# Patient Record
Sex: Female | Born: 1980
Health system: Southern US, Community
[De-identification: ages and names within clinical notes are randomized; demographics above are authoritative.]

## PROBLEM LIST (undated history)

## (undated) DIAGNOSIS — F419 Anxiety disorder, unspecified: Secondary | ICD-10-CM

## (undated) DIAGNOSIS — K219 Gastro-esophageal reflux disease without esophagitis: Secondary | ICD-10-CM

## (undated) DIAGNOSIS — F32A Depression, unspecified: Secondary | ICD-10-CM

## (undated) DIAGNOSIS — N390 Urinary tract infection, site not specified: Secondary | ICD-10-CM

## (undated) HISTORY — PX: COLPOSCOPY W/ BIOPSY / CURETTAGE: SUR283

## (undated) HISTORY — PX: CRYOTHERAPY: SHX1416

## (undated) HISTORY — PX: WISDOM TOOTH EXTRACTION: SHX21

## (undated) HISTORY — PX: TONSILLECTOMY: SUR1361

---
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
## 1999-09-13 ENCOUNTER — Other Ambulatory Visit: Admission: RE | Admit: 1999-09-13 | Discharge: 1999-09-13 | Payer: Self-pay | Admitting: Obstetrics and Gynecology

## 1999-11-15 ENCOUNTER — Other Ambulatory Visit: Admission: RE | Admit: 1999-11-15 | Discharge: 1999-11-15 | Payer: Self-pay | Admitting: Obstetrics and Gynecology

## 1999-11-16 ENCOUNTER — Other Ambulatory Visit: Admission: RE | Admit: 1999-11-16 | Discharge: 1999-11-16 | Payer: Self-pay | Admitting: Obstetrics and Gynecology

## 1999-11-16 ENCOUNTER — Encounter (INDEPENDENT_AMBULATORY_CARE_PROVIDER_SITE_OTHER): Payer: Self-pay

## 2000-05-28 ENCOUNTER — Other Ambulatory Visit: Admission: RE | Admit: 2000-05-28 | Discharge: 2000-05-28 | Payer: Self-pay | Admitting: Obstetrics and Gynecology

## 2000-05-29 ENCOUNTER — Other Ambulatory Visit: Admission: RE | Admit: 2000-05-29 | Discharge: 2000-05-29 | Payer: Self-pay | Admitting: Obstetrics and Gynecology

## 2000-05-29 ENCOUNTER — Encounter (INDEPENDENT_AMBULATORY_CARE_PROVIDER_SITE_OTHER): Payer: Self-pay

## 2000-12-27 ENCOUNTER — Other Ambulatory Visit: Admission: RE | Admit: 2000-12-27 | Discharge: 2000-12-27 | Payer: Self-pay | Admitting: Obstetrics and Gynecology

## 2001-09-08 ENCOUNTER — Other Ambulatory Visit: Admission: RE | Admit: 2001-09-08 | Discharge: 2001-09-08 | Payer: Self-pay | Admitting: Obstetrics and Gynecology

## 2002-02-19 ENCOUNTER — Other Ambulatory Visit: Admission: RE | Admit: 2002-02-19 | Discharge: 2002-02-19 | Payer: Self-pay | Admitting: Obstetrics and Gynecology

## 2002-08-19 ENCOUNTER — Other Ambulatory Visit: Admission: RE | Admit: 2002-08-19 | Discharge: 2002-08-19 | Payer: Self-pay | Admitting: Obstetrics and Gynecology

## 2003-03-25 ENCOUNTER — Other Ambulatory Visit: Admission: RE | Admit: 2003-03-25 | Discharge: 2003-03-25 | Payer: Self-pay | Admitting: Obstetrics and Gynecology

## 2004-05-19 ENCOUNTER — Other Ambulatory Visit: Admission: RE | Admit: 2004-05-19 | Discharge: 2004-05-19 | Payer: Self-pay | Admitting: Obstetrics and Gynecology

## 2005-12-19 ENCOUNTER — Emergency Department (HOSPITAL_COMMUNITY): Admission: EM | Admit: 2005-12-19 | Discharge: 2005-12-20 | Payer: Self-pay | Admitting: Emergency Medicine

## 2009-04-26 ENCOUNTER — Ambulatory Visit (HOSPITAL_COMMUNITY): Admission: RE | Admit: 2009-04-26 | Discharge: 2009-04-26 | Payer: Self-pay | Admitting: Obstetrics and Gynecology

## 2009-10-25 ENCOUNTER — Encounter: Admission: RE | Admit: 2009-10-25 | Discharge: 2009-10-25 | Payer: Self-pay | Admitting: Obstetrics and Gynecology

## 2010-02-19 ENCOUNTER — Emergency Department (HOSPITAL_COMMUNITY)
Admission: EM | Admit: 2010-02-19 | Discharge: 2010-02-19 | Disposition: A | Discharge status: Home / Self Care | Payer: BC Managed Care – PPO | Attending: Emergency Medicine | Admitting: Emergency Medicine

## 2010-02-19 ENCOUNTER — Emergency Department (HOSPITAL_COMMUNITY): Payer: BC Managed Care – PPO

## 2010-02-19 DIAGNOSIS — O200 Threatened abortion: Secondary | ICD-10-CM | POA: Insufficient documentation

## 2010-02-19 DIAGNOSIS — R109 Unspecified abdominal pain: Secondary | ICD-10-CM | POA: Insufficient documentation

## 2010-02-19 DIAGNOSIS — N898 Other specified noninflammatory disorders of vagina: Secondary | ICD-10-CM | POA: Insufficient documentation

## 2010-02-19 LAB — URINALYSIS, ROUTINE W REFLEX MICROSCOPIC
Ketones, ur: NEGATIVE mg/dL
Leukocytes, UA: NEGATIVE
Nitrite: NEGATIVE
Protein, ur: NEGATIVE mg/dL
Urine Glucose, Fasting: NEGATIVE mg/dL
Urobilinogen, UA: 0.2 mg/dL (ref 0.0–1.0)

## 2010-02-19 LAB — POCT PREGNANCY, URINE: Preg Test, Ur: NEGATIVE

## 2010-02-19 LAB — URINE MICROSCOPIC-ADD ON

## 2010-02-19 LAB — WET PREP, GENITAL: Trich, Wet Prep: NONE SEEN

## 2010-02-19 LAB — ABO/RH: ABO/RH(D): O POS

## 2010-02-20 LAB — GC/CHLAMYDIA PROBE AMP, GENITAL
Chlamydia, DNA Probe: NEGATIVE
GC Probe Amp, Genital: NEGATIVE

## 2010-04-05 LAB — ABO/RH: ABO/RH(D): O POS

## 2010-05-23 LAB — GC/CHLAMYDIA PROBE AMP, GENITAL: Gonorrhea: NEGATIVE

## 2010-05-23 LAB — HEPATITIS B SURFACE ANTIGEN: Hepatitis B Surface Ag: NEGATIVE

## 2010-05-23 LAB — ABO/RH

## 2010-05-23 LAB — RPR: RPR: NONREACTIVE

## 2010-05-23 LAB — HIV ANTIBODY (ROUTINE TESTING W REFLEX): HIV: NONREACTIVE

## 2010-06-27 DIAGNOSIS — M5430 Sciatica, unspecified side: Secondary | ICD-10-CM

## 2010-11-30 LAB — STREP B DNA PROBE: GBS: NEGATIVE

## 2010-12-18 ENCOUNTER — Inpatient Hospital Stay (HOSPITAL_COMMUNITY)
Admission: AD | Admit: 2010-12-18 | Discharge: 2010-12-18 | Disposition: A | Discharge status: Home / Self Care | Payer: BC Managed Care – PPO | Source: Ambulatory Visit | Attending: Obstetrics and Gynecology | Admitting: Obstetrics and Gynecology

## 2010-12-18 ENCOUNTER — Encounter (HOSPITAL_COMMUNITY): Payer: Self-pay | Admitting: *Deleted

## 2010-12-18 ENCOUNTER — Emergency Department: Payer: Self-pay | Admitting: Internal Medicine

## 2010-12-18 DIAGNOSIS — Z3400 Encounter for supervision of normal first pregnancy, unspecified trimester: Secondary | ICD-10-CM

## 2010-12-18 DIAGNOSIS — O99891 Other specified diseases and conditions complicating pregnancy: Secondary | ICD-10-CM | POA: Insufficient documentation

## 2010-12-18 DIAGNOSIS — M5430 Sciatica, unspecified side: Secondary | ICD-10-CM | POA: Insufficient documentation

## 2010-12-18 HISTORY — DX: Anxiety disorder, unspecified: F41.9

## 2010-12-18 HISTORY — DX: Urinary tract infection, site not specified: N39.0

## 2010-12-18 MED ORDER — HYDROCODONE-ACETAMINOPHEN 5-500 MG PO TABS: 1.0000 | ORAL_TABLET | Freq: Four times a day (QID) | ORAL | Status: AC | PRN

## 2010-12-18 NOTE — Progress Notes (Signed)
Pt has been having siatica pain but it got worse this evening and she went to [facility] and they gave her a vicodin around [phone] and was told that Dr Arelia Sneddon wanted her to come here and be evaluated-her pain in now better that it has been all day

## 2010-12-18 NOTE — ED Provider Notes (Signed)
[name] y.o.G1P0 @[redacted]w[redacted]d  Chief Complaint  Patient presents with  . Hip Pain    SUBJECTIVE  HPI: Transferred from [facility] where she was evaluated for left sciatica. She was given Vicodin with good relief. The pain is constant and begins that all the sciatic notch and radiates down posterior left leg and at its worse she has numbness of her left foot. She's tried local  heat and cold packs with little relief. Aware of mild nonpainful contractions. No vaginal bleeding. No leaking. Good fetal movement.  Past Medical History  Diagnosis Date  . Anxiety   . UTI (lower urinary tract infection)    Past Surgical History  Procedure Date  . Tonsillectomy   . Wisdom tooth extraction   . Cryotherapy   . Colposcopy w/ biopsy / curettage    History   Social History  . Marital Status: Single    Spouse Name: N/A    Number of Children: N/A  . Years of Education: N/A   Occupational History  . Not on file.   Social History Main Topics  . Smoking status: Former Smoker -- 0.5 packs/day for 12 years    Types: Cigarettes  . Smokeless tobacco: Not on file  . Alcohol Use: No  . Drug Use: No  . Sexually Active: Yes   Other Topics Concern  . Not on file   Social History Narrative  . No narrative on file   No current facility-administered medications on file prior to encounter.   No current outpatient prescriptions on file prior to encounter.   Allergies  Allergen Reactions  . Penicillins Other (See Comments)    Childhood allergy; reaction unknown  . Sulfa Antibiotics Other (See Comments)    Childhood allergy; reaction unknown    ROS: Pertinent items in HPI  OBJECTIVE  BP 114/65  Pulse 72  Temp(Src) 99 F (37.2 C) (Oral)  Resp 20  Ht 5\' 1"  (1.549 m)  Wt 63.957 kg (141 lb)  BMI 26.64 kg/m2  SpO2 99%  Toco: irreg mild UCs FHR 120 baseline, reactive VE per RN: ftp/80/-2 vtx  Physical Exam  Constitutional: She is oriented to person, place, and  time. She appears distressed.  HENT:  Head: Normocephalic.  Neck: Neck supple.  Cardiovascular: Normal rate.   Pulmonary/Chest: Effort normal.  Abdominal: Soft. There is no tenderness.  Musculoskeletal: Normal range of motion. She exhibits tenderness. She exhibits no edema.       Tender over left buttock and along post leg and popliteal are.   Neurological: She is alert and oriented to person, place, and time. She has normal reflexes. Coordination normal.     ASSESSMENT  Left sciatica G1  At [redacted]w[redacted]d with reassuring fetal HR   PLAN  Rx Vicodin #15. F/U in office this week. Kick counts

## 2010-12-18 NOTE — Progress Notes (Signed)
Pt was instructed by MD to [facility].  Pt states she feels much better since the vicodin.

## 2010-12-28 ENCOUNTER — Telehealth (HOSPITAL_COMMUNITY): Payer: Self-pay | Admitting: *Deleted

## 2010-12-28 ENCOUNTER — Encounter (HOSPITAL_COMMUNITY): Payer: Self-pay | Admitting: *Deleted

## 2010-12-28 NOTE — Telephone Encounter (Signed)
Preadmission screen  

## 2010-12-29 NOTE — H&P (Signed)
[name], [name] NO.:  [id number]  MEDICAL RECORD NO.:  [id number]  LOCATION:                                 FACILITY:  PHYSICIAN:  Duke Salvia. Marcelle Overlie, M.D.DATE OF BIRTH:  Oct 23, 1980  DATE OF ADMISSION:  12/30/2010 DATE OF DISCHARGE:                             HISTORY & PHYSICAL   CHIEF COMPLAINT:  For labor induction at term, oligohydramnios.  HISTORY OF PRESENT ILLNESS:  A 30 year old, G4, P 0-0-3-0, EDD December 27, 2010, was seen by Dr. Henderson Cloud on December 27, 2010, vertex by ultrasound, EFW 6 pounds 13 ounces with an AFI of the 5th percentile. Presents now for labor induction.  GBS was negative.  The remainder of her prenatal course has been uneventful with 1-hour GTT of 133.  PAST MEDICAL HISTORY:  Please see the [facility] form for details.  PHYSICAL EXAMINATION:  VITAL SIGNS:  Temp 98.2, blood pressure 114/78. HEENT:  Unremarkable. NECK:  Supple without masses. LUNGS:  Clear. CARDIOVASCULAR:  Regular rate and rhythm without murmurs, rubs, or gallops heard. BREASTS:  Not examined. PELVIC:  Term fundal height.  Fetal heart rate 140, cervix was 1, 90%, - 1 vertex per Dr. Huel Coventry exam.  Urine negative for protein.  IMPRESSION: 1. Term intrauterine pregnancy. 2. Oligohydramnios by ultrasound.  PLAN:  Pitocin/AROM labor induction.  Protocol reviewed with the patient.     Becca Bayne M. Marcelle Overlie, M.D.     RM[facility]/MEDQ  D:  12/29/2010  T:  12/29/2010  Job:  [id number]

## 2010-12-30 ENCOUNTER — Encounter (HOSPITAL_COMMUNITY): Payer: Self-pay | Admitting: Anesthesiology

## 2010-12-30 ENCOUNTER — Other Ambulatory Visit: Payer: Self-pay | Admitting: Obstetrics and Gynecology

## 2010-12-30 ENCOUNTER — Inpatient Hospital Stay (HOSPITAL_COMMUNITY): Payer: BC Managed Care – PPO | Admitting: Anesthesiology

## 2010-12-30 ENCOUNTER — Encounter (HOSPITAL_COMMUNITY)
Admission: RE | Disposition: A | Discharge status: Home / Self Care | Payer: Self-pay | Source: Ambulatory Visit | Attending: Obstetrics and Gynecology

## 2010-12-30 ENCOUNTER — Inpatient Hospital Stay (HOSPITAL_COMMUNITY)
Admission: RE | Admit: 2010-12-30 | Discharge: 2011-01-02 | DRG: 371 | Disposition: A | Discharge status: Home / Self Care | Payer: BC Managed Care – PPO | Source: Ambulatory Visit | Attending: Obstetrics and Gynecology | Admitting: Obstetrics and Gynecology

## 2010-12-30 ENCOUNTER — Encounter (HOSPITAL_COMMUNITY): Payer: Self-pay

## 2010-12-30 DIAGNOSIS — O339 Maternal care for disproportion, unspecified: Secondary | ICD-10-CM | POA: Diagnosis present

## 2010-12-30 DIAGNOSIS — O330 Maternal care for disproportion due to deformity of maternal pelvic bones: Secondary | ICD-10-CM | POA: Diagnosis present

## 2010-12-30 DIAGNOSIS — O4100X0 Oligohydramnios, unspecified trimester, not applicable or unspecified: Principal | ICD-10-CM | POA: Diagnosis present

## 2010-12-30 DIAGNOSIS — O324XX0 Maternal care for high head at term, not applicable or unspecified: Secondary | ICD-10-CM | POA: Diagnosis present

## 2010-12-30 LAB — CBC
HCT: 38.1 % (ref 36.0–46.0)
Hemoglobin: 12.6 g/dL (ref 12.0–15.0)
MCH: 30.7 pg (ref 26.0–34.0)
MCHC: 33.1 g/dL (ref 30.0–36.0)
RDW: 13.6 % (ref 11.5–15.5)

## 2010-12-30 LAB — RPR: RPR Ser Ql: NONREACTIVE

## 2010-12-30 SURGERY — Surgical Case
Anesthesia: Epidural | Site: Abdomen | Wound class: Clean Contaminated

## 2010-12-30 MED ORDER — OXYTOCIN 10 UNIT/ML IJ SOLN
INTRAMUSCULAR | Status: AC
  Filled 2010-12-30: qty 4

## 2010-12-30 MED ORDER — IBUPROFEN 600 MG PO TABS: 600.0000 mg | ORAL_TABLET | Freq: Four times a day (QID) | ORAL | Status: DC | PRN

## 2010-12-30 MED ORDER — SODIUM BICARBONATE 8.4 % IV SOLN
INTRAVENOUS | Status: DC | PRN
  Administered 2010-12-30: 10 mL via EPIDURAL

## 2010-12-30 MED ORDER — CEFAZOLIN SODIUM 1-5 GM-% IV SOLN
INTRAVENOUS | Status: AC
  Filled 2010-12-30: qty 50

## 2010-12-30 MED ORDER — OXYTOCIN 20 UNITS IN LACTATED RINGERS INFUSION - SIMPLE: 125.0000 mL/h | Freq: Once | INTRAVENOUS | Status: DC

## 2010-12-30 MED ORDER — FENTANYL 2.5 MCG/ML BUPIVACAINE 1/10 % EPIDURAL INFUSION (WH - ANES)
INTRAMUSCULAR | Status: DC | PRN
  Administered 2010-12-30: 14 mL/h via EPIDURAL

## 2010-12-30 MED ORDER — PHENYLEPHRINE 40 MCG/ML (10ML) SYRINGE FOR IV PUSH (FOR BLOOD PRESSURE SUPPORT): 80.0000 ug | PREFILLED_SYRINGE | INTRAVENOUS | Status: DC | PRN

## 2010-12-30 MED ORDER — LACTATED RINGERS IV SOLN: 500.0000 mL | INTRAVENOUS | Status: DC | PRN

## 2010-12-30 MED ORDER — MEPERIDINE HCL 25 MG/ML IJ SOLN
INTRAMUSCULAR | Status: AC
  Filled 2010-12-30: qty 1

## 2010-12-30 MED ORDER — EPHEDRINE 5 MG/ML INJ
10.0000 mg | INTRAVENOUS | Status: DC | PRN
  Filled 2010-12-30: qty 4

## 2010-12-30 MED ORDER — EPHEDRINE 5 MG/ML INJ: 10.0000 mg | INTRAVENOUS | Status: DC | PRN

## 2010-12-30 MED ORDER — ONDANSETRON HCL 4 MG/2ML IJ SOLN
4.0000 mg | Freq: Four times a day (QID) | INTRAMUSCULAR | Status: DC | PRN
  Administered 2010-12-30 (×2): 4 mg via INTRAVENOUS
  Filled 2010-12-30 (×2): qty 2

## 2010-12-30 MED ORDER — OXYTOCIN 10 UNIT/ML IJ SOLN
20.0000 [IU] | INTRAVENOUS | Status: DC | PRN
  Administered 2010-12-30: 20 [IU] via INTRAVENOUS

## 2010-12-30 MED ORDER — MORPHINE SULFATE 0.5 MG/ML IJ SOLN
INTRAMUSCULAR | Status: AC
  Filled 2010-12-30: qty 10

## 2010-12-30 MED ORDER — CEFAZOLIN SODIUM 1-5 GM-% IV SOLN
INTRAVENOUS | Status: DC | PRN
  Administered 2010-12-30: 1 g via INTRAVENOUS

## 2010-12-30 MED ORDER — OXYCODONE-ACETAMINOPHEN 5-325 MG PO TABS: 2.0000 | ORAL_TABLET | ORAL | Status: DC | PRN

## 2010-12-30 MED ORDER — FENTANYL 2.5 MCG/ML BUPIVACAINE 1/10 % EPIDURAL INFUSION (WH - ANES)
14.0000 mL/h | INTRAMUSCULAR | Status: DC
  Administered 2010-12-30 (×2): 14 mL/h via EPIDURAL
  Filled 2010-12-30 (×4): qty 60

## 2010-12-30 MED ORDER — PHENYLEPHRINE HCL 10 MG/ML IJ SOLN
INTRAMUSCULAR | Status: DC | PRN
  Administered 2010-12-30 (×2): 80 ug via INTRAVENOUS
  Administered 2010-12-30: 120 ug via INTRAVENOUS
  Administered 2010-12-30: 80 ug via INTRAVENOUS

## 2010-12-30 MED ORDER — ACETAMINOPHEN 325 MG PO TABS: 650.0000 mg | ORAL_TABLET | ORAL | Status: DC | PRN

## 2010-12-30 MED ORDER — CEFAZOLIN SODIUM 1-5 GM-% IV SOLN
1.0000 g | Freq: Once | INTRAVENOUS | Status: DC
  Filled 2010-12-30: qty 50

## 2010-12-30 MED ORDER — DIPHENHYDRAMINE HCL 50 MG/ML IJ SOLN: 12.5000 mg | INTRAMUSCULAR | Status: DC | PRN

## 2010-12-30 MED ORDER — MORPHINE SULFATE (PF) 0.5 MG/ML IJ SOLN
INTRAMUSCULAR | Status: DC | PRN
  Administered 2010-12-30: 4 mg via EPIDURAL

## 2010-12-30 MED ORDER — LIDOCAINE-EPINEPHRINE (PF) 2 %-1:200000 IJ SOLN
INTRAMUSCULAR | Status: AC
  Filled 2010-12-30: qty 20

## 2010-12-30 MED ORDER — TERBUTALINE SULFATE 1 MG/ML IJ SOLN: 0.2500 mg | Freq: Once | INTRAMUSCULAR | Status: DC | PRN

## 2010-12-30 MED ORDER — LIDOCAINE HCL 1.5 % IJ SOLN
INTRAMUSCULAR | Status: DC | PRN
  Administered 2010-12-30 (×2): 5 mL via EPIDURAL

## 2010-12-30 MED ORDER — PHENYLEPHRINE 40 MCG/ML (10ML) SYRINGE FOR IV PUSH (FOR BLOOD PRESSURE SUPPORT)
80.0000 ug | PREFILLED_SYRINGE | INTRAVENOUS | Status: DC | PRN
  Filled 2010-12-30: qty 5

## 2010-12-30 MED ORDER — MORPHINE SULFATE (PF) 0.5 MG/ML IJ SOLN
INTRAMUSCULAR | Status: DC | PRN
  Administered 2010-12-30: 1 mg via INTRAVENOUS

## 2010-12-30 MED ORDER — MEPERIDINE HCL 25 MG/ML IJ SOLN
INTRAMUSCULAR | Status: DC | PRN
  Administered 2010-12-30: 25 mg via INTRAVENOUS

## 2010-12-30 MED ORDER — OXYTOCIN 20 UNITS IN LACTATED RINGERS INFUSION - SIMPLE
1.0000 m[IU]/min | INTRAVENOUS | Status: DC
  Administered 2010-12-30: 2 m[IU]/min via INTRAVENOUS
  Filled 2010-12-30: qty 1000

## 2010-12-30 MED ORDER — PHENYLEPHRINE 40 MCG/ML (10ML) SYRINGE FOR IV PUSH (FOR BLOOD PRESSURE SUPPORT)
PREFILLED_SYRINGE | INTRAVENOUS | Status: AC
  Filled 2010-12-30: qty 10

## 2010-12-30 MED ORDER — SODIUM BICARBONATE 8.4 % IV SOLN
INTRAVENOUS | Status: AC
  Filled 2010-12-30: qty 50

## 2010-12-30 MED ORDER — LACTATED RINGERS IV SOLN
INTRAVENOUS | Status: DC
  Administered 2010-12-30: 09:00:00 via INTRAVENOUS
  Administered 2010-12-30: 125 mL/h via INTRAVENOUS
  Administered 2010-12-30: 23:00:00 via INTRAVENOUS

## 2010-12-30 MED ORDER — ONDANSETRON HCL 4 MG/2ML IJ SOLN
INTRAMUSCULAR | Status: DC | PRN
  Administered 2010-12-30: 4 mg via INTRAVENOUS

## 2010-12-30 MED ORDER — LIDOCAINE HCL (PF) 1 % IJ SOLN: 30.0000 mL | INTRAMUSCULAR | Status: DC | PRN

## 2010-12-30 MED ORDER — CITRIC ACID-SODIUM CITRATE 334-500 MG/5ML PO SOLN
30.0000 mL | ORAL | Status: DC | PRN
  Administered 2010-12-30: 30 mL via ORAL
  Filled 2010-12-30: qty 15

## 2010-12-30 MED ORDER — LACTATED RINGERS IV SOLN
500.0000 mL | Freq: Once | INTRAVENOUS | Status: AC
  Administered 2010-12-30: 500 mL via INTRAVENOUS

## 2010-12-30 MED ORDER — FENTANYL CITRATE 0.05 MG/ML IJ SOLN
INTRAMUSCULAR | Status: AC
  Filled 2010-12-30: qty 2

## 2010-12-30 MED ORDER — FLEET ENEMA 7-19 GM/118ML RE ENEM: 1.0000 | ENEMA | RECTAL | Status: DC | PRN

## 2010-12-30 MED ORDER — LACTATED RINGERS IV SOLN
INTRAVENOUS | Status: DC | PRN
  Administered 2010-12-30 – 2010-12-31 (×2): via INTRAVENOUS

## 2010-12-30 MED ORDER — SODIUM CHLORIDE 0.9 % IR SOLN
Status: DC | PRN
  Administered 2010-12-30 (×2): 1

## 2010-12-30 MED ORDER — ONDANSETRON HCL 4 MG/2ML IJ SOLN
INTRAMUSCULAR | Status: AC
  Filled 2010-12-30: qty 2

## 2010-12-30 MED ORDER — PHENYLEPHRINE 40 MCG/ML (10ML) SYRINGE FOR IV PUSH (FOR BLOOD PRESSURE SUPPORT)
PREFILLED_SYRINGE | INTRAVENOUS | Status: AC
  Filled 2010-12-30: qty 5

## 2010-12-30 MED ORDER — OXYTOCIN BOLUS FROM INFUSION
500.0000 mL | Freq: Once | INTRAVENOUS | Status: DC
  Filled 2010-12-30: qty 500

## 2010-12-30 MED ORDER — FENTANYL CITRATE 0.05 MG/ML IJ SOLN
INTRAMUSCULAR | Status: DC | PRN
  Administered 2010-12-30: 50 ug via INTRAVENOUS

## 2010-12-30 SURGICAL SUPPLY — 24 items
CLOTH BEACON ORANGE TIMEOUT ST (SAFETY) ×2 IMPLANT
DRESSING TELFA 8X3 (GAUZE/BANDAGES/DRESSINGS) ×1 IMPLANT
ELECT REM PT RETURN 9FT ADLT (ELECTROSURGICAL) ×2
ELECTRODE REM PT RTRN 9FT ADLT (ELECTROSURGICAL) ×1 IMPLANT
EXTRACTOR VACUUM M CUP 4 TUBE (SUCTIONS) IMPLANT
GAUZE SPONGE 4X4 12PLY STRL LF (GAUZE/BANDAGES/DRESSINGS) ×3 IMPLANT
GLOVE BIO SURGEON STRL SZ7 (GLOVE) ×4 IMPLANT
GOWN PREVENTION PLUS LG XLONG (DISPOSABLE) ×6 IMPLANT
KIT ABG SYR 3ML LUER SLIP (SYRINGE) ×1 IMPLANT
NDL HYPO 25X5/8 SAFETYGLIDE (NEEDLE) ×1 IMPLANT
NEEDLE HYPO 25X5/8 SAFETYGLIDE (NEEDLE) ×2 IMPLANT
NS IRRIG 1000ML POUR BTL (IV SOLUTION) ×2 IMPLANT
PACK C SECTION WH (CUSTOM PROCEDURE TRAY) ×2 IMPLANT
PAD ABD 7.5X8 STRL (GAUZE/BANDAGES/DRESSINGS) IMPLANT
SLEEVE SCD COMPRESS KNEE MED (MISCELLANEOUS) ×1 IMPLANT
STRIP CLOSURE SKIN 1/4X4 (GAUZE/BANDAGES/DRESSINGS) ×1 IMPLANT
SUT CHROMIC 0 CTX 36 (SUTURE) ×6 IMPLANT
SUT MON AB 4-0 PS1 27 (SUTURE) ×2 IMPLANT
SUT PDS AB 0 CT1 27 (SUTURE) ×4 IMPLANT
SUT VIC AB 3-0 CT1 27 (SUTURE) ×4
SUT VIC AB 3-0 CT1 TAPERPNT 27 (SUTURE) ×2 IMPLANT
TOWEL OR 17X24 6PK STRL BLUE (TOWEL DISPOSABLE) ×4 IMPLANT
TRAY FOLEY CATH 14FR (SET/KITS/TRAYS/PACK) ×1 IMPLANT
WATER STERILE IRR 1000ML POUR (IV SOLUTION) ×2 IMPLANT

## 2010-12-30 NOTE — Progress Notes (Signed)
Patient ID: [name], female   DOB: [birth year], 30 y.o.   MRN: [id number] Discussed trial VE secondary to exhaustion.  Foley out, C/C/+3, Kiwi applied and checked, 3 traction efforts w/ maternal pushing effort without sig descent.  Discussed need for CS for CPD.

## 2010-12-30 NOTE — Progress Notes (Signed)
Patient ID: [name], female   DOB: 08-Nov-1980, 30 y.o.   MRN: [id number] C/C/ pushing right @ 3 hrs, although very numb initially.  Now +2 to +3, OA, stable FHR

## 2010-12-30 NOTE — Anesthesia Procedure Notes (Signed)
Epidural Patient location during procedure: OB Start time: 12/30/2010 12:36 PM End time: 12/30/2010 12:42 PM Reason for block: procedure for pain  Staffing Anesthesiologist: Sandrea Hughs Performed by: anesthesiologist   Preanesthetic Checklist Completed: patient identified, site marked, surgical consent, pre-op evaluation, timeout performed, IV checked, risks and benefits discussed and monitors and equipment checked  Epidural Patient position: sitting Prep: site prepped and draped and DuraPrep Patient monitoring: continuous pulse ox and blood pressure Approach: midline Injection technique: LOR air  Needle:  Needle type: Tuohy  Needle gauge: 17 G Needle length: 9 cm Needle insertion depth: 5 cm cm Catheter type: closed end flexible Catheter size: 19 Gauge Catheter at skin depth: 10 cm Test dose: negative and 1.5% lidocaine  Assessment Sensory level: T8 Events: blood not aspirated, injection not painful, no injection resistance, negative IV test and no paresthesia

## 2010-12-30 NOTE — Anesthesia Preprocedure Evaluation (Addendum)

## 2010-12-30 NOTE — Progress Notes (Signed)
Pt turning from side to side ad lib

## 2010-12-30 NOTE — Progress Notes (Signed)
Dr Marcelle Overlie notified of sve, fhr, uc's, dosage of Pitocin.  MD stated to leave Pitocin at present dosage

## 2010-12-31 ENCOUNTER — Encounter (HOSPITAL_COMMUNITY): Payer: Self-pay

## 2010-12-31 LAB — CBC
HCT: 28.8 % — ABNORMAL LOW (ref 36.0–46.0)
MCH: 31.3 pg (ref 26.0–34.0)
MCHC: 33.7 g/dL (ref 30.0–36.0)
MCV: 92.9 fL (ref 78.0–100.0)
RDW: 13.7 % (ref 11.5–15.5)

## 2010-12-31 MED ORDER — OXYTOCIN 20 UNITS IN LACTATED RINGERS INFUSION - SIMPLE: 125.0000 mL/h | INTRAVENOUS | Status: AC

## 2010-12-31 MED ORDER — DIPHENHYDRAMINE HCL 50 MG/ML IJ SOLN: 12.5000 mg | INTRAMUSCULAR | Status: DC | PRN

## 2010-12-31 MED ORDER — SCOPOLAMINE 1 MG/3DAYS TD PT72
1.0000 | MEDICATED_PATCH | Freq: Once | TRANSDERMAL | Status: DC
  Administered 2010-12-31: 1.5 mg via TRANSDERMAL

## 2010-12-31 MED ORDER — TETANUS-DIPHTH-ACELL PERTUSSIS 5-2.5-18.5 LF-MCG/0.5 IM SUSP: 0.5000 mL | Freq: Once | INTRAMUSCULAR | Status: DC

## 2010-12-31 MED ORDER — ONDANSETRON HCL 4 MG/2ML IJ SOLN: 4.0000 mg | INTRAMUSCULAR | Status: DC | PRN

## 2010-12-31 MED ORDER — SODIUM CHLORIDE 0.9 % IJ SOLN: 3.0000 mL | INTRAMUSCULAR | Status: DC | PRN

## 2010-12-31 MED ORDER — NALBUPHINE SYRINGE 5 MG/0.5 ML
5.0000 mg | INJECTION | INTRAMUSCULAR | Status: DC | PRN
  Filled 2010-12-31: qty 1

## 2010-12-31 MED ORDER — DIPHENHYDRAMINE HCL 50 MG/ML IJ SOLN: 25.0000 mg | INTRAMUSCULAR | Status: DC | PRN

## 2010-12-31 MED ORDER — OXYCODONE-ACETAMINOPHEN 5-325 MG PO TABS
1.0000 | ORAL_TABLET | Freq: Four times a day (QID) | ORAL | Status: DC | PRN
  Administered 2010-12-31 – 2011-01-01 (×2): 1 via ORAL
  Administered 2011-01-01 – 2011-01-02 (×4): 2 via ORAL
  Filled 2010-12-31 (×3): qty 2
  Filled 2010-12-31: qty 1
  Filled 2010-12-31 (×2): qty 2

## 2010-12-31 MED ORDER — BISACODYL 10 MG RE SUPP
10.0000 mg | Freq: Every day | RECTAL | Status: DC | PRN
  Administered 2011-01-02: 10 mg via RECTAL
  Filled 2010-12-31: qty 1

## 2010-12-31 MED ORDER — NALOXONE HCL 0.4 MG/ML IJ SOLN: 0.4000 mg | INTRAMUSCULAR | Status: DC | PRN

## 2010-12-31 MED ORDER — PRENATAL PLUS 27-1 MG PO TABS
1.0000 | ORAL_TABLET | Freq: Every day | ORAL | Status: DC
  Administered 2010-12-31 – 2011-01-02 (×3): 1 via ORAL
  Filled 2010-12-31 (×2): qty 1
  Filled 2010-12-31: qty 3

## 2010-12-31 MED ORDER — ZOLPIDEM TARTRATE 5 MG PO TABS: 5.0000 mg | ORAL_TABLET | Freq: Every evening | ORAL | Status: DC | PRN

## 2010-12-31 MED ORDER — KETOROLAC TROMETHAMINE 30 MG/ML IJ SOLN: 30.0000 mg | Freq: Four times a day (QID) | INTRAMUSCULAR | Status: AC | PRN

## 2010-12-31 MED ORDER — HYDROMORPHONE HCL PF 1 MG/ML IJ SOLN: 0.2500 mg | INTRAMUSCULAR | Status: DC | PRN

## 2010-12-31 MED ORDER — IBUPROFEN 800 MG PO TABS
800.0000 mg | ORAL_TABLET | Freq: Three times a day (TID) | ORAL | Status: DC | PRN
  Administered 2010-12-31 – 2011-01-02 (×5): 800 mg via ORAL
  Filled 2010-12-31 (×5): qty 1

## 2010-12-31 MED ORDER — ONDANSETRON HCL 4 MG/2ML IJ SOLN: 4.0000 mg | Freq: Three times a day (TID) | INTRAMUSCULAR | Status: DC | PRN

## 2010-12-31 MED ORDER — SODIUM CHLORIDE 0.9 % IV SOLN
1.0000 ug/kg/h | INTRAVENOUS | Status: DC | PRN
  Filled 2010-12-31: qty 2.5

## 2010-12-31 MED ORDER — NALBUPHINE SYRINGE 5 MG/0.5 ML
5.0000 mg | INJECTION | INTRAMUSCULAR | Status: DC | PRN
  Administered 2010-12-31: 5 mg via SUBCUTANEOUS
  Filled 2010-12-31: qty 1

## 2010-12-31 MED ORDER — KETOROLAC TROMETHAMINE 60 MG/2ML IM SOLN
INTRAMUSCULAR | Status: AC
  Administered 2010-12-31: 60 mg via INTRAMUSCULAR
  Filled 2010-12-31: qty 2

## 2010-12-31 MED ORDER — DIBUCAINE 1 % RE OINT: 1.0000 "application " | TOPICAL_OINTMENT | RECTAL | Status: DC | PRN

## 2010-12-31 MED ORDER — WITCH HAZEL-GLYCERIN EX PADS: 1.0000 "application " | MEDICATED_PAD | CUTANEOUS | Status: DC | PRN

## 2010-12-31 MED ORDER — LANOLIN HYDROUS EX OINT: 1.0000 "application " | TOPICAL_OINTMENT | CUTANEOUS | Status: DC | PRN

## 2010-12-31 MED ORDER — SCOPOLAMINE 1 MG/3DAYS TD PT72
MEDICATED_PATCH | TRANSDERMAL | Status: AC
  Administered 2010-12-31: 1.5 mg via TRANSDERMAL
  Filled 2010-12-31: qty 1

## 2010-12-31 MED ORDER — MENTHOL 3 MG MT LOZG: 1.0000 | LOZENGE | OROMUCOSAL | Status: DC | PRN

## 2010-12-31 MED ORDER — SODIUM CHLORIDE 0.9 % IV SOLN: 250.0000 mL | INTRAVENOUS | Status: DC | PRN

## 2010-12-31 MED ORDER — KETOROLAC TROMETHAMINE 60 MG/2ML IM SOLN
60.0000 mg | Freq: Once | INTRAMUSCULAR | Status: AC | PRN
  Administered 2010-12-31: 60 mg via INTRAMUSCULAR

## 2010-12-31 MED ORDER — FLEET ENEMA 7-19 GM/118ML RE ENEM: 1.0000 | ENEMA | Freq: Every day | RECTAL | Status: DC | PRN

## 2010-12-31 MED ORDER — SIMETHICONE 80 MG PO CHEW
80.0000 mg | CHEWABLE_TABLET | Freq: Three times a day (TID) | ORAL | Status: DC
  Administered 2010-12-31 – 2011-01-02 (×7): 80 mg via ORAL

## 2010-12-31 MED ORDER — IBUPROFEN 800 MG PO TABS: 800.0000 mg | ORAL_TABLET | Freq: Three times a day (TID) | ORAL | Status: DC | PRN

## 2010-12-31 MED ORDER — IBUPROFEN 600 MG PO TABS: 600.0000 mg | ORAL_TABLET | Freq: Four times a day (QID) | ORAL | Status: DC | PRN

## 2010-12-31 MED ORDER — SODIUM CHLORIDE 0.9 % IJ SOLN: 3.0000 mL | Freq: Two times a day (BID) | INTRAMUSCULAR | Status: DC

## 2010-12-31 MED ORDER — DIPHENHYDRAMINE HCL 25 MG PO CAPS
25.0000 mg | ORAL_CAPSULE | ORAL | Status: DC | PRN
  Administered 2010-12-31: 25 mg via ORAL
  Filled 2010-12-31: qty 1

## 2010-12-31 MED ORDER — MEPERIDINE HCL 25 MG/ML IJ SOLN: 6.2500 mg | INTRAMUSCULAR | Status: DC | PRN

## 2010-12-31 MED ORDER — KETOROLAC TROMETHAMINE 30 MG/ML IJ SOLN: 15.0000 mg | Freq: Once | INTRAMUSCULAR | Status: DC | PRN

## 2010-12-31 MED ORDER — DIPHENHYDRAMINE HCL 25 MG PO CAPS
25.0000 mg | ORAL_CAPSULE | Freq: Four times a day (QID) | ORAL | Status: DC | PRN
  Administered 2010-12-31: 25 mg via ORAL
  Filled 2010-12-31: qty 1

## 2010-12-31 MED ORDER — SIMETHICONE 80 MG PO CHEW: 80.0000 mg | CHEWABLE_TABLET | ORAL | Status: DC | PRN

## 2010-12-31 MED ORDER — MEASLES, MUMPS & RUBELLA VAC ~~LOC~~ INJ
0.5000 mL | INJECTION | Freq: Once | SUBCUTANEOUS | Status: DC
  Filled 2010-12-31: qty 0.5

## 2010-12-31 MED ORDER — PROMETHAZINE HCL 25 MG/ML IJ SOLN: 6.2500 mg | INTRAMUSCULAR | Status: DC | PRN

## 2010-12-31 MED ORDER — SENNOSIDES-DOCUSATE SODIUM 8.6-50 MG PO TABS
2.0000 | ORAL_TABLET | Freq: Every day | ORAL | Status: DC
  Administered 2010-12-31 – 2011-01-01 (×2): 2 via ORAL

## 2010-12-31 NOTE — Progress Notes (Signed)
Patient was referred for history of depression/anxiety. * Referral screened out by Clinical Social Worker because none of the following criteria appear to apply:       ~ History of anxiety/depression during this pregnancy, or of post-partum depression.       ~ Diagnosis of anxiety and/or depression within last 3 years       ~ History of depression due to pregnancy loss/loss of child  OR * Patient's symptoms currently being treated with medication and/or therapy.  Please contact the Clinical Social Worker if needs arise, or by the patient's request.    CSW spoke with RN who reports no concerns at this time.     Karn Cassis

## 2010-12-31 NOTE — Progress Notes (Signed)
Subjective: Postpartum Day 1: Cesarean Delivery Patient reports tolerating PO.    Objective: Vital signs in last 24 hours: Temp:  [97.3 F (36.3 C)-99.4 F (37.4 C)] 98.6 F (37 C) (12/16 0657) Pulse Rate:  [51-125] 56  (12/16 0657) Resp:  [14-20] 18  (12/16 0657) BP: (100-142)/(57-105) 108/66 mmHg (12/16 0657) SpO2:  [97 %-100 %] 98 % (12/16 0657)  Physical Exam:  General: alert Lochia: appropriate Uterine Fundus: firm Incision: healing well DVT Evaluation: No evidence of DVT seen on physical exam.   Basename 12/31/10 0501 12/30/10 0855  HGB 9.7* 12.6  HCT 28.8* 38.1    Assessment/Plan: Status post Cesarean section. Doing well postoperatively.  Continue current care.  Elver Stadler M 12/31/2010, 10:24 AM

## 2010-12-31 NOTE — Transfer of Care (Signed)
Immediate Anesthesia Transfer of Care Note  Patient: [name]  Procedure(s) Performed:  CESAREAN SECTION - primary of baby boy  at 2343  APGAR8/9  Patient Location: PACU  Anesthesia Type: Epidural  Level of Consciousness: awake, alert  and oriented  Airway & Oxygen Therapy: Patient Spontanous Breathing  Post-op Assessment: Report given to PACU RN and Post -op Vital signs reviewed and stable  Post vital signs: stable  Complications: No apparent anesthesia complications

## 2010-12-31 NOTE — Anesthesia Postprocedure Evaluation (Signed)
Anesthesia Post Note  Patient: [name]  Procedure(s) Performed:  CESAREAN SECTION - primary of baby boy  at 2343  APGAR8/9  Anesthesia type: Epidural  Patient location: Mother/Baby  Post pain: Pain level controlled  Post assessment: Post-op Vital signs reviewed  Last Vitals:  Filed Vitals:   12/31/10 0657  BP: 108/66  Pulse: 56  Temp: 37 C  Resp: 18    Post vital signs: Reviewed  Level of consciousness: awake  Complications: No apparent anesthesia complications

## 2010-12-31 NOTE — Op Note (Signed)
Preoperative diagnosis: Failure to descend, failed VE  Postoperative diagnosis same  Procedure: Primary low transverse cesarean section  Surgeon: Marcelle Overlie  EBL: 800 cc  Specimen: Placenta to pathology  Procedure and findings:  This patient had a 3-1/2 hour second stage, due to exhaustion decision made to proceed with the assisted delivery from complete complete way +3 Foley catheter was removed she had good epidural anesthesia acuity was at plowed traction efforts cord made with maternal pushing effort x3 with no significant descend fetal heart rate stable throughout decision made to proceed with cesarean section.  She was moved quickly to the cesarean section room epidural anesthesia was dosed completely Foley catheter was repositioned. The abdomen prepped and draped in usual manner for sterile bowel procedures. Transverse Pfannenstiel incision made 2 finger restaurant symphysis carried down to the fascia which was incised and extended transversely. Rectus muscle divided in the midline peritoneum entered superiorly without incident and extended in a vertical fashion. The vesicouterine syrup serosa was incised and the bladder was bluntly and sharply dissected below, bladder blade repositioned at that point. Transverse incision made lower segment extended with blunt dissection the vertex was then delivered easily and female Apgars 9 and 9 cord pH was obtained. Placenta was removed manually intact sent to pathology uterus exteriorized cavity wiped clean with laparotomy pack closure obtained the first layer of 0 chromic in a locked fashion followed by an imbricating layer of 0 chromic. This is hemostatic bilateral tubes and ovaries are normal the bladder flap area was intact and hemostatic prior to closure sponge denies precast reported as correct x2. Enclose a running 2-0 Vicryl suture. 2-0 Vicryl interrupted sutures used to close the rectus muscles in the midline 0 PDS suture was then used to close the  fascia from laterally to midline on either side subcutaneous tissue was irrigated and the hemostatic 4-0 Monocryl subcuticular suture sterile dressing applied she did receive Ancef 1 g IV preoperatively and Pitocin IV after the cord was clamped clear urine noted in the case mother and baby doing well at that point Dictated with [facility] Brittiny Levitz M. Marcelle Overlie MD

## 2010-12-31 NOTE — Addendum Note (Signed)
Addendum  created 12/31/10 0747 by Doreene Burke   Modules edited:Notes Section

## 2010-12-31 NOTE — Anesthesia Postprocedure Evaluation (Signed)
Anesthesia Post Note  Patient: [name]  Procedure(s) Performed:  CESAREAN SECTION - primary of baby boy  at 2343  APGAR8/9  Anesthesia type: Epidural  Patient location: PACU  Post pain: Pain level controlled  Post assessment: Post-op Vital signs reviewed  Last Vitals:  Filed Vitals:   12/30/10 2301  BP: 129/60  Pulse: 73  Temp: 36.9 C  Resp: 18    Post vital signs: Reviewed  Level of consciousness: awake  Complications: No apparent anesthesia complications

## 2010-12-31 NOTE — Progress Notes (Signed)
Correct documentation - patient arrived in pacu at 0015 from the OR

## 2010-12-31 NOTE — OR Nursing (Signed)
Fundal massage by DLWegner RN, cord ph [phone]

## 2011-01-01 NOTE — Progress Notes (Signed)
Subjective: Postpartum Day 2: Cesarean Delivery Patient reports tolerating PO and no problems voiding.  Desires baby circ Objective: Vital signs in last 24 hours: Temp:  [97.8 F (36.6 C)-98.6 F (37 C)] 98.4 F (36.9 C) (12/17 0602) Pulse Rate:  [66-85] 66  (12/17 0602) Resp:  [20] 20  (12/17 0602) BP: (94-110)/(61-74) 110/74 mmHg (12/17 0602) SpO2:  [97 %-98 %] 98 % (12/16 2257)  Physical Exam:  General: alert and cooperative Lochia: appropriate Uterine Fundus: firm Incision: healing well DVT Evaluation: No evidence of DVT seen on physical exam.   Basename 12/31/10 0501 12/30/10 0855  HGB 9.7* 12.6  HCT 28.8* 38.1    Assessment/Plan: Status post Cesarean section. Doing well postoperatively.  Continue current care.  CURTIS,CAROL G 01/01/2011, 8:01 AM

## 2011-01-02 ENCOUNTER — Encounter (HOSPITAL_COMMUNITY): Payer: Self-pay | Admitting: Obstetrics and Gynecology

## 2011-01-02 MED ORDER — IBUPROFEN 800 MG PO TABS: 800.0000 mg | ORAL_TABLET | Freq: Three times a day (TID) | ORAL | Status: AC | PRN

## 2011-01-02 MED ORDER — OXYCODONE-ACETAMINOPHEN 5-325 MG PO TABS: 1.0000 | ORAL_TABLET | Freq: Four times a day (QID) | ORAL | Status: AC | PRN

## 2011-01-02 MED ORDER — ONDANSETRON 8 MG PO TBDP
8.0000 mg | ORAL_TABLET | Freq: Four times a day (QID) | ORAL | Status: DC | PRN
  Administered 2011-01-02: 8 mg via ORAL
  Filled 2011-01-02: qty 1

## 2011-01-02 NOTE — Discharge Summary (Signed)
Obstetric Discharge Summary Reason for Admission: induction of labor and for oligohydramnios Prenatal Procedures: ultrasound Intrapartum Procedures: cesarean: low cervical, transverse and after a failed VE Postpartum Procedures: none Complications-Operative and Postpartum: none Hemoglobin  Date Value Range Status  12/31/2010 9.7* 12.0-15.0 (g/dL) Final     DELTA CHECK NOTED     REPEATED TO VERIFY     HCT  Date Value Range Status  12/31/2010 28.8* 36.0-46.0 (%) Final    Discharge Diagnoses: Term Pregnancy-delivered  Discharge Information: Date: 01/02/2011 Activity: pelvic rest Diet: routine Medications: PNV, Ibuprofen and Percocet Condition: stable Instructions: refer to practice specific booklet Discharge to: home   Newborn Data: Live born female  Birth Weight: 6 lb 7.3 oz (2929 g) APGAR: 8, 9  Home with mother.  [name] G 01/02/2011, 8:16 AM

## 2011-01-02 NOTE — Progress Notes (Signed)
Patient complain of nausea. No orders for po nausea meds. Dr. Rana Snare called. Order received. Will administer med and continue to monitor patient.

## 2011-01-02 NOTE — Progress Notes (Signed)
Subjective: Postpartum Day 3: Cesarean Delivery Patient reports tolerating PO, + flatus and no problems voiding.  Nausea resolved, took Percocet on empty stomach  Objective: Vital signs in last 24 hours: Temp:  [97.6 F (36.4 C)-98.1 F (36.7 C)] 97.6 F (36.4 C) (12/18 0523) Pulse Rate:  [61-69] 69  (12/18 0523) Resp:  [18] 18  (12/18 0523) BP: (104-119)/(62-79) 119/79 mmHg (12/18 0523)  Physical Exam:  General: alert and cooperative Lochia: appropriate Uterine Fundus: firm Incision: healing well DVT Evaluation: No evidence of DVT seen on physical exam.   Basename 12/31/10 0501 12/30/10 0855  HGB 9.7* 12.6  HCT 28.8* 38.1    Assessment/Plan: Status post Cesarean section. Doing well postoperatively.  Discharge home with standard precautions and return to clinic in 1 week.  Linsi Humann G 01/02/2011, 8:03 AM

## 2011-06-20 ENCOUNTER — Encounter: Payer: Self-pay | Admitting: Internal Medicine

## 2011-06-20 ENCOUNTER — Ambulatory Visit (INDEPENDENT_AMBULATORY_CARE_PROVIDER_SITE_OTHER): Payer: BC Managed Care – PPO | Admitting: Internal Medicine

## 2011-06-20 VITALS — BP 108/70 | HR 76 | Temp 98.1°F | Resp 16 | Ht 61.0 in | Wt 119.5 lb

## 2011-06-20 DIAGNOSIS — F429 Obsessive-compulsive disorder, unspecified: Secondary | ICD-10-CM

## 2011-06-20 DIAGNOSIS — Z8639 Personal history of other endocrine, nutritional and metabolic disease: Secondary | ICD-10-CM

## 2011-06-20 NOTE — Patient Instructions (Signed)
Read about fluvoxamine (Luvox)  Or anafranil (clompramine0 for treatment of OCD   Consider letting me refer you to Dr. Rico Junker to help you with your phobias  Consider the Low Glycemic Index Diet and 6 smaller meals daily .  This boosts your metabolism and regulates your blood sugars:   7 AM Low carbohydrate Protein  Shakes (EAS Carb Control  Or Atkins ,  Available everywhere,   In  cases at [employer] )  2.5 carbs  (Add or substitute a toasted sandwhich thin w/ peanut butter)  10 AM: Protein bar by Atkins (snack size,  Chocolate lover's variety at  [employer])    Lunch: sandwich on pita bread or flatbread (Joseph's makes a pita bread and a flat bread , available at [employer] and [employer]; Toufayah makes a low carb flatbread available at [employer] and [employer]) Mission makes a low carb whole wheat tortilla available at [employer] most grocery stores   3 PM:  Mid day :  Another protein bar,  Or a  cheese stick, 1/4 cup of almonds, walnuts, pistachios, pecans, peanuts,  Macadamia nuts  6 PM  Dinner:  "mean and green:"  Meat/chicken/fish, salad, and green veggie : use ranch, vinagrette,  Blue cheese, etc  9 PM snack : Breyer's low carb fudgsicle or  ice cream bar (Carb Smart), or  Weight Watcher's ice cream bar , or another protein shake

## 2011-06-20 NOTE — Progress Notes (Signed)
Patient ID: [name], female   DOB: 10-Feb-1980, 31 y.o.   MRN: [id number]   Patient Active Problem List  Diagnoses  . Obsessive compulsive disorder (or obsessive compulsive neurosis)  . H/O reactive hypoglycemia    Subjective:  CC:   Chief Complaint  Patient presents with  . New Patient    HPI:   [name] a 31 y.o. female who presents  Past Medical History  Diagnosis Date  . Anxiety   . UTI (lower urinary tract infection)     Past Surgical History  Procedure Date  . Tonsillectomy   . Wisdom tooth extraction   . Cryotherapy   . Colposcopy w/ biopsy / curettage   . Tonsillectomy   . Cesarean section 12/30/2010    Procedure: CESAREAN SECTION;  Surgeon: Meriel Pica;  Location: [facility];  Service: Gynecology;  Laterality: N/A;  primary of baby boy  at 2343  APGAR8/9         The following portions of the patient's history were reviewed and updated as appropriate: Allergies, current medications, and problem list.    Review of Systems:   12 Pt  review of systems was negative except those addressed in the HPI,     History   Social History  . Marital Status: Single    Spouse Name: N/A    Number of Children: N/A  . Years of Education: N/A   Occupational History  . Not on file.   Social History Main Topics  . Smoking status: Former Smoker -- 0.5 packs/day for 12 years    Types: Cigarettes    Quit date: 04/16/2010  . Smokeless tobacco: Never Used  . Alcohol Use: Yes  . Drug Use: No  . Sexually Active: Yes    Birth Control/ Protection: Pill   Other Topics Concern  . Not on file   Social History Narrative  . No narrative on file    Objective:  BP 108/70  Pulse 76  Temp(Src) 98.1 F (36.7 C) (Oral)  Resp 16  Ht 5\' 1"  (1.549 m)  Wt 119 lb 8 oz (54.205 kg)  BMI 22.58 kg/m2  SpO2 97%  LMP 06/20/2011  Breastfeeding? No  General appearance: alert, cooperative and appears stated age Ears: normal TM's and external ear canals  both ears Throat: lips, mucosa, and tongue normal; teeth and gums normal Neck: no adenopathy, no carotid bruit, supple, symmetrical, trachea midline and thyroid not enlarged, symmetric, no tenderness/mass/nodules Back: symmetric, no curvature. ROM normal. No CVA tenderness. Lungs: clear to auscultation bilaterally Heart: regular rate and rhythm, S1, S2 normal, no murmur, click, rub or gallop Abdomen: soft, non-tender; bowel sounds normal; no masses,  no organomegaly Pulses: 2+ and symmetric Skin: Skin color, texture, turgor normal. No rashes or lesions Lymph nodes: Cervical, supraclavicular, and axillary nodes normal.  Assessment and Plan: Obsessive compulsive disorder (or obsessive compulsive neurosis) I have given her some information about OCD which she has reviewed and feel that she has many of those tendencies.   We discussed a trial of luvox in place of lexapro to see if the medication would help her hobias and obsessiveness,  Will start at loading dose and return in one month  H/O reactive hypoglycemia Discussed ways to combine starches and protein in daly meals to avoid recurrent episodes.  Also recommended avoiding sugared bevarages.  Records requested .    Updated Medication List Outpatient Encounter Prescriptions as of 06/20/2011  Medication Sig Dispense Refill  . Norgestimate-Ethinyl Estradiol Triphasic (  ORTHO TRI-CYCLEN LO) 0.18/0.215/0.25 MG-25 MCG tablet Take 1 tablet by mouth daily.      Marland Kitchen omeprazole (PRILOSEC OTC) 20 MG tablet Take 20 mg by mouth daily.      . prenatal vitamin w/FE, FA (PRENATAL 1 + 1) 27-1 MG TABS Take 1 tablet by mouth daily.        . psyllium (METAMUCIL) 58.6 % packet Take 1 packet by mouth daily.      Marland Kitchen DISCONTD: escitalopram (LEXAPRO) 10 MG tablet Take 10 mg by mouth daily.      Marland Kitchen DISCONTD: acetaminophen (TYLENOL) 325 MG tablet Take 650 mg by mouth every 6 (six) hours as needed. For pain

## 2011-06-21 ENCOUNTER — Encounter: Payer: Self-pay | Admitting: Internal Medicine

## 2011-06-22 ENCOUNTER — Encounter: Payer: Self-pay | Admitting: Internal Medicine

## 2011-06-22 MED ORDER — FLUVOXAMINE MALEATE 50 MG PO TABS: 50.0000 mg | ORAL_TABLET | Freq: Every day | ORAL | Status: DC

## 2011-06-24 ENCOUNTER — Encounter: Payer: Self-pay | Admitting: Internal Medicine

## 2011-06-24 NOTE — Assessment & Plan Note (Signed)
Discussed ways to combine starches and protein in daly meals to avoid recurrent episodes.  Also recommended avoiding sugared bevarages.  Records requested .

## 2011-06-24 NOTE — Assessment & Plan Note (Signed)
I have given her some information about OCD which she has reviewed and feel that she has many of those tendencies.   We discussed a trial of luvox in place of lexapro to see if the medication would help her hobias and obsessiveness,  Will start at loading dose and return in one month

## 2011-06-25 ENCOUNTER — Telehealth: Payer: Self-pay | Admitting: Internal Medicine

## 2011-06-25 DIAGNOSIS — F429 Obsessive-compulsive disorder, unspecified: Secondary | ICD-10-CM

## 2011-06-25 NOTE — Telephone Encounter (Signed)
Please call patient with one month follow up

## 2011-06-26 ENCOUNTER — Telehealth: Payer: Self-pay | Admitting: Internal Medicine

## 2011-06-26 NOTE — Telephone Encounter (Signed)
How about Dr. Toni Amend? Thanks marj

## 2011-06-26 NOTE — Telephone Encounter (Signed)
Patient is coming in on July 12th at 8:15.

## 2011-06-26 NOTE — Telephone Encounter (Signed)
DR Jeanie Sewer IS NOT TAKING NEW PATIENTS AT THIS TIME AS HE IS BOOKED THROUGH AUGUST.   WHO WOULD YOU LIKE IN HIS PLACE

## 2011-06-27 ENCOUNTER — Telehealth: Payer: Self-pay | Admitting: Internal Medicine

## 2011-06-27 NOTE — Telephone Encounter (Signed)
[facility] at [facility] is taking new patients   Who else can I try

## 2011-06-27 NOTE — Telephone Encounter (Signed)
Julie Hawthorne, MD in [place], [place] or Julie Roers, MD in [place]? Not sure if she is in same group as [place].  thanks

## 2011-06-29 NOTE — Telephone Encounter (Signed)
The only other psychiatrist I cna personnally recommend is [facility] in [place].  If she is not taking new patients,  I will  recommend referral to jane perrin, the therapist

## 2011-06-29 NOTE — Telephone Encounter (Signed)
Theador Hawthorne nor Tamsen Roers are taking new patients either.

## 2011-07-06 ENCOUNTER — Other Ambulatory Visit: Payer: Self-pay | Admitting: Internal Medicine

## 2011-07-06 MED ORDER — FLUVOXAMINE MALEATE 50 MG PO TABS: 100.0000 mg | ORAL_TABLET | Freq: Every day | ORAL | Status: DC

## 2011-07-09 ENCOUNTER — Other Ambulatory Visit: Payer: Self-pay | Admitting: Internal Medicine

## 2011-07-09 MED ORDER — ALPRAZOLAM ER 0.5 MG PO TB24: 0.5000 mg | ORAL_TABLET | ORAL | Status: AC

## 2011-07-10 ENCOUNTER — Ambulatory Visit (INDEPENDENT_AMBULATORY_CARE_PROVIDER_SITE_OTHER): Payer: BC Managed Care – PPO | Admitting: Professional

## 2011-07-10 DIAGNOSIS — F411 Generalized anxiety disorder: Secondary | ICD-10-CM

## 2011-07-23 ENCOUNTER — Encounter: Payer: Self-pay | Admitting: Internal Medicine

## 2011-07-23 ENCOUNTER — Ambulatory Visit (INDEPENDENT_AMBULATORY_CARE_PROVIDER_SITE_OTHER): Payer: BC Managed Care – PPO | Admitting: Internal Medicine

## 2011-07-23 VITALS — BP 104/78 | HR 94 | Temp 98.0°F | Resp 16 | Wt 118.8 lb

## 2011-07-23 DIAGNOSIS — F429 Obsessive-compulsive disorder, unspecified: Secondary | ICD-10-CM

## 2011-07-23 MED ORDER — LAMOTRIGINE 25 MG PO TABS: 25.0000 mg | ORAL_TABLET | Freq: Every day | ORAL | Status: DC

## 2011-07-23 NOTE — Assessment & Plan Note (Signed)
Adding lamictal and discontinuing luvox over the next two weeks.  Continue alprazolam ER

## 2011-07-23 NOTE — Patient Instructions (Addendum)
Start lamotrigine at 25 mg daily at bedtime. Reduce luvox to 1/2 tablet daily for one week then stop.  After one week increase the lamotrigine to 50 mg at bedtime   Continue the alprazolam at the current dose for now   We will keep looking for a psychiatrist   Consider the Low Glycemic Index Diet and 6 smaller meals daily .  This boosts your metabolism and regulates your sugars:   7 AM Low carbohydrate Protein  Shakes (EAS Carb Control  Or Atkins ,  Available everywhere,   In  cases at [employer]Js )  2.5 carbs  (Add or substitute a toasted sandwhich thin w/ peanut butter)  10 AM: Protein bar by Atkins (snack size,  Chocolate lover's variety at  [employer]J's)    Lunch: sandwich on pita bread or flatbread (Joseph's makes a pita bread and a flat bread , available at [employer] and [employer]; Toufayah makes a low carb flatbread available at [employer] and [employer]) Mission makes a low carb whole wheat tortilla available at [employer] most grocery stores   3 PM:  Mid day :  Another protein bar,  Or a  cheese stick, 1/4 cup of almonds, walnuts, pistachios, pecans, peanuts,  Macadamia nuts  6 PM  Dinner:  "mean and green:"  Meat/chicken/fish, salad, and green veggie : use ranch, vinagrette,  Blue cheese, etc  9 PM snack : Breyer's low carb fudgsicle or  ice cream bar (Carb Smart), or  Weight Watcher's ice cream bar , or another protein shake

## 2011-07-23 NOTE — Progress Notes (Signed)
Patient ID: [name], female   DOB: 1980/02/27, 31 y.o.   MRN: [id number]  Patient Active Problem List  Diagnosis  . Obsessive compulsive disorder (or obsessive compulsive neurosis)  . H/O reactive hypoglycemia    Subjective:  CC:   Chief Complaint  Patient presents with  . Follow-up    HPI:   [name] a 31 y.o. female who presents for one month follow up on treatment of OCD and GAD.  Over the past month we tried titrating her medications up to 100 mg on Luvox, and found that her symptoms worsened so she reduced her dose to 50 mg one week ago.  The alprazolam ER helped immensely with her anxiety, but she has been very very irritable with her family  And feels that her moods are labile.  She has ho history of bipolar disorder. She had an evaluation with Dr. Luiz Blare who did not make any medication changes or recommend anything helpful.  She did not make a return appointment.    Past Medical History  Diagnosis Date  . Anxiety   . UTI (lower urinary tract infection)     Past Surgical History  Procedure Date  . Tonsillectomy   . Wisdom tooth extraction   . Cryotherapy   . Colposcopy w/ biopsy / curettage   . Tonsillectomy   . Cesarean section 12/30/2010    Procedure: CESAREAN SECTION;  Surgeon: Meriel Pica;  Location: [facility];  Service: Gynecology;  Laterality: N/A;  primary of baby boy  at 2343  APGAR8/9         The following portions of the patient's history were reviewed and updated as appropriate: Allergies, current medications, and problem list.    Review of Systems:   12 Pt  review of systems was negative except those addressed in the HPI,     History   Social History  . Marital Status: Single    Spouse Name: N/A    Number of Children: N/A  . Years of Education: N/A   Occupational History  . Not on file.   Social History Main Topics  . Smoking status: Former Smoker -- 0.5 packs/day for 12 years    Types: Cigarettes    Quit date:  04/16/2010  . Smokeless tobacco: Never Used  . Alcohol Use: Yes  . Drug Use: No  . Sexually Active: Yes    Birth Control/ Protection: Pill   Other Topics Concern  . Not on file   Social History Narrative  . No narrative on file    Objective:  BP 104/78  Pulse 94  Temp 98 F (36.7 C) (Oral)  Resp 16  Wt 118 lb 12 oz (53.865 kg)  SpO2 96%  LMP 07/22/2011  General appearance: alert, cooperative and appears stated age Lungs: clear to auscultation bilaterally Heart: regular rate and rhythm, S1, S2 normal, no murmur, click, rub or gallop Abdomen: soft, non-tender; bowel sounds normal; no masses,  no organomegaly Pulses: 2+ and symmetric Skin: Skin color, texture, turgor normal. No rashes or lesions Lymph nodes: Cervical, supraclavicular, and axillary nodes normal. Psych:articulate, makes eye contact,  Affect normal  Assessment and Plan:  Obsessive compulsive disorder (or obsessive compulsive neurosis) Adding lamictal and discontinuing luvox over the next two weeks.  Continue alprazolam ER    Updated Medication List Outpatient Encounter Prescriptions as of 07/23/2011  Medication Sig Dispense Refill  . ALPRAZolam (XANAX XR) 0.5 MG 24 hr tablet Take 1 tablet (0.5 mg total) by mouth every  morning.  30 tablet  0  . fluvoxaMINE (LUVOX) 50 MG tablet Take 50 mg by mouth at bedtime.      . Norgestimate-Ethinyl Estradiol Triphasic (ORTHO TRI-CYCLEN LO) 0.18/0.215/0.25 MG-25 MCG tablet Take 1 tablet by mouth daily.      Marland Kitchen omeprazole (PRILOSEC OTC) 20 MG tablet Take 20 mg by mouth daily.      . prenatal vitamin w/FE, FA (PRENATAL 1 + 1) 27-1 MG TABS Take 1 tablet by mouth daily.        . psyllium (METAMUCIL) 58.6 % packet Take 1 packet by mouth daily.      Marland Kitchen DISCONTD: fluvoxaMINE (LUVOX) 50 MG tablet Take 2 tablets (100 mg total) by mouth at bedtime.  30 tablet  1  . lamoTRIgine (LAMICTAL) 25 MG tablet Take 1 tablet (25 mg total) by mouth daily.  60 tablet  1     No orders of the  defined types were placed in this encounter.    No Follow-up on file.

## 2011-07-24 ENCOUNTER — Ambulatory Visit: Payer: BC Managed Care – PPO | Admitting: Professional

## 2011-07-27 ENCOUNTER — Ambulatory Visit: Payer: BC Managed Care – PPO | Admitting: Internal Medicine

## 2011-08-02 ENCOUNTER — Telehealth: Payer: Self-pay | Admitting: Internal Medicine

## 2011-08-02 DIAGNOSIS — F429 Obsessive-compulsive disorder, unspecified: Secondary | ICD-10-CM

## 2012-01-16 NOTE — L&D Delivery Note (Signed)
Delivery Note At 10:43 AM a viable female was delivered via VBAC, Spontaneous (Presentation: ; Occiput Posterior).  APGAR: , ; weight .   Placenta status: Intact, Spontaneous.  Cord:  with the following complications: None.  Cord pH: sent NICU attended   Anesthesia: Epidural  Episiotomy: None Lacerations: first deg Suture Repair: 3.0 vicryl rapide Est. Blood Loss (mL): 250  L lab skin tag removed  Mom to postpartum.  Baby to NICU, stable.  Evita Merida M 06/23/2012, 10:59 AM

## 2012-06-18 ENCOUNTER — Inpatient Hospital Stay (HOSPITAL_COMMUNITY): Payer: BC Managed Care – PPO

## 2012-06-18 ENCOUNTER — Encounter (HOSPITAL_COMMUNITY): Payer: Self-pay | Admitting: *Deleted

## 2012-06-18 ENCOUNTER — Inpatient Hospital Stay (HOSPITAL_COMMUNITY)
Admission: AD | Admit: 2012-06-18 | Discharge: 2012-06-25 | DRG: 372 | Disposition: A | Discharge status: Home / Self Care | Payer: BC Managed Care – PPO | Source: Ambulatory Visit | Attending: Obstetrics and Gynecology | Admitting: Obstetrics and Gynecology

## 2012-06-18 DIAGNOSIS — Z8639 Personal history of other endocrine, nutritional and metabolic disease: Secondary | ICD-10-CM

## 2012-06-18 DIAGNOSIS — F429 Obsessive-compulsive disorder, unspecified: Secondary | ICD-10-CM

## 2012-06-18 DIAGNOSIS — N9089 Other specified noninflammatory disorders of vulva and perineum: Secondary | ICD-10-CM | POA: Diagnosis present

## 2012-06-18 DIAGNOSIS — O34219 Maternal care for unspecified type scar from previous cesarean delivery: Secondary | ICD-10-CM | POA: Diagnosis present

## 2012-06-18 LAB — CBC
HCT: 34.4 % — ABNORMAL LOW (ref 36.0–46.0)
MCHC: 34 g/dL (ref 30.0–36.0)
MCV: 91.7 fL (ref 78.0–100.0)
Platelets: 177 10*3/uL (ref 150–400)
RDW: 13.8 % (ref 11.5–15.5)

## 2012-06-18 LAB — URINALYSIS, ROUTINE W REFLEX MICROSCOPIC
Ketones, ur: NEGATIVE mg/dL
Leukocytes, UA: NEGATIVE
Nitrite: NEGATIVE
Protein, ur: NEGATIVE mg/dL
Urobilinogen, UA: 0.2 mg/dL (ref 0.0–1.0)
pH: 7 (ref 5.0–8.0)

## 2012-06-18 LAB — COMPREHENSIVE METABOLIC PANEL
Albumin: 2.8 g/dL — ABNORMAL LOW (ref 3.5–5.2)
Alkaline Phosphatase: 80 U/L (ref 39–117)
BUN: 5 mg/dL — ABNORMAL LOW (ref 6–23)
CO2: 21 mEq/L (ref 19–32)
Chloride: 105 mEq/L (ref 96–112)
GFR calc non Af Amer: >90 mL/min (ref >90)
Glucose, Bld: 97 mg/dL (ref 70–99)
Potassium: 3.1 mEq/L — ABNORMAL LOW (ref 3.5–5.1)
Total Bilirubin: 0.2 mg/dL — ABNORMAL LOW (ref 0.3–1.2)

## 2012-06-18 LAB — URIC ACID: Uric Acid, Serum: 3.3 mg/dL (ref 2.4–7.0)

## 2012-06-18 MED ORDER — ZOLPIDEM TARTRATE 5 MG PO TABS: 5.0000 mg | ORAL_TABLET | Freq: Every evening | ORAL | Status: DC | PRN

## 2012-06-18 MED ORDER — LACTATED RINGERS IV SOLN
INTRAVENOUS | Status: DC
  Administered 2012-06-19 – 2012-06-22 (×7): via INTRAVENOUS

## 2012-06-18 MED ORDER — CALCIUM CARBONATE ANTACID 500 MG PO CHEW: 2.0000 | CHEWABLE_TABLET | ORAL | Status: DC | PRN

## 2012-06-18 MED ORDER — ACETAMINOPHEN 325 MG PO TABS
650.0000 mg | ORAL_TABLET | ORAL | Status: DC | PRN
  Administered 2012-06-20 – 2012-06-22 (×5): 650 mg via ORAL
  Filled 2012-06-18 (×5): qty 2

## 2012-06-18 MED ORDER — SERTRALINE HCL 25 MG PO TABS
25.0000 mg | ORAL_TABLET | Freq: Every day | ORAL | Status: DC
  Administered 2012-06-19 – 2012-06-22 (×4): 25 mg via ORAL
  Filled 2012-06-18 (×5): qty 1

## 2012-06-18 MED ORDER — MAGNESIUM SULFATE 40 G IN LACTATED RINGERS - SIMPLE
2.0000 g/h | INTRAVENOUS | Status: DC
  Administered 2012-06-19: 3 g/h via INTRAVENOUS
  Administered 2012-06-20 – 2012-06-21 (×2): 2 g/h via INTRAVENOUS
  Filled 2012-06-18 (×4): qty 500

## 2012-06-18 MED ORDER — PRENATAL MULTIVITAMIN CH
1.0000 | ORAL_TABLET | Freq: Every day | ORAL | Status: DC
  Administered 2012-06-20 – 2012-06-22 (×3): 1 via ORAL
  Filled 2012-06-18 (×3): qty 1

## 2012-06-18 MED ORDER — MAGNESIUM SULFATE BOLUS VIA INFUSION
4.0000 g | Freq: Once | INTRAVENOUS | Status: AC
  Administered 2012-06-18: 4 g via INTRAVENOUS
  Filled 2012-06-18: qty 500

## 2012-06-18 MED ORDER — DOCUSATE SODIUM 100 MG PO CAPS
100.0000 mg | ORAL_CAPSULE | Freq: Every day | ORAL | Status: DC
  Administered 2012-06-19 – 2012-06-22 (×4): 100 mg via ORAL
  Filled 2012-06-18 (×4): qty 1

## 2012-06-18 MED ORDER — LACTATED RINGERS IV SOLN
Freq: Once | INTRAVENOUS | Status: AC
  Administered 2012-06-18: 21:00:00 via INTRAVENOUS

## 2012-06-18 MED ORDER — BETAMETHASONE SOD PHOS & ACET 6 (3-3) MG/ML IJ SUSP
12.0000 mg | INTRAMUSCULAR | Status: AC
  Administered 2012-06-18 – 2012-06-19 (×2): 12 mg via INTRAMUSCULAR
  Filled 2012-06-18 (×2): qty 2

## 2012-06-18 NOTE — MAU Provider Note (Signed)
History     CSN: [id number]  Arrival date and time: 06/18/12 1850   First Provider Initiated Contact with Patient 06/18/12 2020      Chief Complaint  Patient presents with  . Contractions   HPI [name] 32 y.o. [redacted]w[redacted]d  Reports having thick, clear, elastic mucus discharge this morning.  Has had back ache today.  Went home and rested all afternoon and has not had improvement in back pain.  Thinks she is having contractions - irregular.  Has not had pain like this before.  No vaginal bleeding.  No leaking.  OB History   Grav Para Term Preterm Abortions TAB SAB Ect Mult Living   5 1 1  3 2 1   1       Past Medical History  Diagnosis Date  . Anxiety   . UTI (lower urinary tract infection)     Past Surgical History  Procedure Laterality Date  . Tonsillectomy    . Wisdom tooth extraction    . Cryotherapy    . Colposcopy w/ biopsy / curettage    . Tonsillectomy    . Cesarean section  12/30/2010    Procedure: CESAREAN SECTION;  Surgeon: Meriel Pica;  Location: [facility];  Service: Gynecology;  Laterality: N/A;  primary of baby boy  at 2343  APGAR8/9    Family History  Problem Relation Age of Onset  . Arthritis Mother   . Hypertension Mother   . Migraines Mother   . Thyroid disease Mother   . Hearing loss Father   . Arthritis Maternal Grandmother   . Cancer Maternal Grandmother     skin  . Kidney disease Maternal Grandmother   . Heart disease Maternal Grandmother   . Arthritis Paternal Grandmother   . Cancer Paternal Grandfather     throat  . Heart disease Paternal Grandfather     History  Substance Use Topics  . Smoking status: Former Smoker -- 0.50 packs/day for 12 years    Types: Cigarettes    Quit date: 04/16/2010  . Smokeless tobacco: Never Used  . Alcohol Use: No    Allergies:  Allergies  Allergen Reactions  . Penicillins Other (See Comments)    Childhood allergy; reaction unknown  . Sulfa Antibiotics Other (See Comments)    Childhood  allergy; reaction unknown  . Wellbutrin (Bupropion Hcl) Other (See Comments)    Unknown; patient does not remember    Prescriptions prior to admission  Medication Sig Dispense Refill  . acetaminophen (TYLENOL) 500 MG tablet Take 1,000 mg by mouth every 6 (six) hours as needed for pain.      Marland Kitchen omeprazole (PRILOSEC) 20 MG capsule Take 20 mg by mouth daily.      . Prenatal Vit-Fe Fumarate-FA (PRENATAL MULTIVITAMIN) TABS Take 1 tablet by mouth at bedtime.      . sertraline (ZOLOFT) 25 MG tablet Take 25 mg by mouth daily.        Review of Systems  Constitutional: Negative for fever.  Gastrointestinal: Negative for nausea, vomiting, diarrhea and constipation.       Some low abdominal cramping.  Genitourinary:       No vaginal discharge. No vaginal bleeding. No dysuria.  Musculoskeletal: Positive for back pain.   Physical Exam   Blood pressure 125/73, pulse 89, temperature 98.8 F (37.1 C), temperature source Oral, resp. rate 16, height 5\' 1"  (1.549 m), weight 127 lb 2 oz (57.664 kg), last menstrual period 07/22/2011, SpO2 100.00%.  Physical Exam  Nursing note and vitals reviewed. Constitutional: She is oriented to person, place, and time. She appears well-developed and well-nourished.  HENT:  Head: Normocephalic.  Eyes: EOM are normal.  Neck: Neck supple.  GI: Soft. There is no tenderness. There is no rebound and no guarding.  Fetal monitor - irregular contractions but client is grimacing with contractions.  Can palpate uterine tightening. FHT baseline 130  - reactive  Genitourinary:  Speculum exam: Vagina - Mod amount of yellow, opaque discharge, no odor Cervix - No contact bleeding FFN collected Bimanual exam: Cervix Soft, FT- 1cm, 50%, presenting part high and not applied to the cervix Chaperone present for exam.  Musculoskeletal: Normal range of motion.  Neurological: She is alert and oriented to person, place, and time.  Skin: Skin is warm and dry.  Psychiatric: She  has a normal mood and affect.    MAU Course  Procedures  MDM 2030  Consult with Dr. Henderson Cloud re: plan of care 2115  Care assumed by [name], CNM Assessment and Plan    [name],[name] 06/18/2012, 8:28 PM   Assumed care of pt at 2115. Awaiting [place] for CL. IV fluids infusing.  2200: Pt up to BR. Crying, UC's worse. Dilation: 1/60/-2, anterior, soft, vtx [name], CNM  Dr. Henderson Cloud informed of cervical change, worsening UC's despite IV fluids, pos fFN.  Admit  For Mag, BMZ.  [place], [place] 06/18/2012 10:07 PM

## 2012-06-18 NOTE — MAU Note (Signed)
Pt states that she has had contractions that are more noticeable since about 1400-her back is hurting more as well

## 2012-06-18 NOTE — H&P (Signed)
[name] is a 32 y.o. female presenting for C/O UCs this evening. No ROM, no bleeding, no fever, no N/V. Maternal Medical History:  Reason for admission: Contractions.   Contractions: Onset was 3-5 hours ago.   Frequency: irregular.   Perceived severity is strong.    Fetal activity: Perceived fetal activity is normal.      OB History   Grav Para Term Preterm Abortions TAB SAB Ect Mult Living   5 1 1  3 2 1   1      Past Medical History  Diagnosis Date  . Anxiety   . UTI (lower urinary tract infection)    Past Surgical History  Procedure Laterality Date  . Tonsillectomy    . Wisdom tooth extraction    . Cryotherapy    . Colposcopy w/ biopsy / curettage    . Tonsillectomy    . Cesarean section  12/30/2010    Procedure: CESAREAN SECTION;  Surgeon: Meriel Pica;  Location: [facility];  Service: Gynecology;  Laterality: N/A;  primary of baby boy  at 68  APGAR8/9   Family History: family history includes Arthritis in her maternal grandmother, mother, and paternal grandmother; Cancer in her maternal grandmother and paternal grandfather; Hearing loss in her father; Heart disease in her maternal grandmother and paternal grandfather; Hypertension in her mother; Kidney disease in her maternal grandmother; Migraines in her mother; and Thyroid disease in her mother. Social History:  reports that she quit smoking about 2 years ago. Her smoking use included Cigarettes. She has a 6 pack-year smoking history. She has never used smokeless tobacco. She reports that she does not drink alcohol or use illicit drugs.   Prenatal Transfer Tool  Maternal Diabetes: No Genetic Screening: Normal Maternal Ultrasounds/Referrals: Normal Fetal Ultrasounds or other Referrals:  None Maternal Substance Abuse:  No Significant Maternal Medications:  None Significant Maternal Lab Results:  None Other Comments:  None  Review of Systems  Constitutional: Negative for fever.  Eyes: Negative for blurred  vision.  Gastrointestinal: Negative for abdominal pain.  Neurological: Negative for headaches.    Dilation: 1 Effacement (%):  (short) Blood pressure 115/66, pulse 78, temperature 98.4 F (36.9 C), temperature source Oral, resp. rate 18, height 5\' 1"  (1.549 m), weight 127 lb (57.607 kg), last menstrual period 07/22/2011, SpO2 100.00%. Maternal Exam:  Uterine Assessment: Contraction strength is moderate.  Contraction frequency is regular.      Fetal Exam Fetal Monitor Review: Pattern: accelerations present.       Physical Exam  Cardiovascular: Normal rate and regular rhythm.   Respiratory: Effort normal and breath sounds normal.  GI: Soft. Bowel sounds are normal. There is no tenderness.  Neurological: She has normal reflexes.    Prenatal labs: ABO, Rh:   Antibody:   Rubella:   RPR:    HBsAg:    HIV:    GBS:     Assessment/Plan: 32 yo G5P1 at 19 5/7weeks with preterm labor and cervical change per MAU nurse check Magnesium Sulfate tocolysis Betamethasone  U/S CBC/CMET D/W patient   Taraoluwa Thakur II,Donald Jacque E 06/18/2012, 10:46 PM

## 2012-06-19 LAB — TYPE AND SCREEN: Antibody Screen: NEGATIVE

## 2012-06-19 MED ORDER — TERBUTALINE SULFATE 1 MG/ML IJ SOLN
0.2500 mg | Freq: Once | INTRAMUSCULAR | Status: AC
  Administered 2012-06-19: 0.25 mg via SUBCUTANEOUS

## 2012-06-19 MED ORDER — AZITHROMYCIN 500 MG PO TABS
500.0000 mg | ORAL_TABLET | Freq: Every day | ORAL | Status: AC
  Administered 2012-06-19: 500 mg via ORAL
  Filled 2012-06-19: qty 1

## 2012-06-19 MED ORDER — TERBUTALINE SULFATE 1 MG/ML IJ SOLN
0.2500 mg | Freq: Once | INTRAMUSCULAR | Status: DC
  Filled 2012-06-19: qty 1

## 2012-06-19 MED ORDER — CLINDAMYCIN PHOSPHATE 900 MG/50ML IV SOLN
900.0000 mg | Freq: Three times a day (TID) | INTRAVENOUS | Status: DC
  Administered 2012-06-19 – 2012-06-23 (×14): 900 mg via INTRAVENOUS
  Filled 2012-06-19 (×15): qty 50

## 2012-06-19 MED ORDER — BUTORPHANOL TARTRATE 1 MG/ML IJ SOLN
1.0000 mg | Freq: Once | INTRAMUSCULAR | Status: AC
  Administered 2012-06-19: 1 mg via INTRAVENOUS

## 2012-06-19 MED ORDER — PANTOPRAZOLE SODIUM 40 MG PO TBEC
40.0000 mg | DELAYED_RELEASE_TABLET | Freq: Every day | ORAL | Status: DC
  Administered 2012-06-19 – 2012-06-22 (×4): 40 mg via ORAL
  Filled 2012-06-19 (×6): qty 1

## 2012-06-19 MED ORDER — CEFAZOLIN SODIUM 1-5 GM-% IV SOLN
1.0000 g | Freq: Three times a day (TID) | INTRAVENOUS | Status: DC
  Administered 2012-06-19 (×2): 1 g via INTRAVENOUS
  Filled 2012-06-19 (×3): qty 50

## 2012-06-19 MED ORDER — CEPHALEXIN 250 MG/5ML PO SUSR: 250.0000 mg | Freq: Four times a day (QID) | ORAL | Status: DC

## 2012-06-19 MED ORDER — BUTORPHANOL TARTRATE 1 MG/ML IJ SOLN
1.0000 mg | INTRAMUSCULAR | Status: DC | PRN
  Administered 2012-06-19 – 2012-06-23 (×3): 1 mg via INTRAVENOUS
  Filled 2012-06-19 (×5): qty 1

## 2012-06-19 MED ORDER — AZITHROMYCIN 250 MG PO TABS
250.0000 mg | ORAL_TABLET | Freq: Every day | ORAL | Status: DC
  Filled 2012-06-19: qty 1

## 2012-06-19 MED ORDER — TERBUTALINE SULFATE 1 MG/ML IJ SOLN
INTRAMUSCULAR | Status: AC
  Filled 2012-06-19: qty 1

## 2012-06-19 MED ORDER — ONDANSETRON 8 MG PO TBDP
8.0000 mg | ORAL_TABLET | Freq: Three times a day (TID) | ORAL | Status: DC | PRN
  Administered 2012-06-19 – 2012-06-23 (×3): 8 mg via ORAL
  Filled 2012-06-19 (×2): qty 1

## 2012-06-19 NOTE — Consult Note (Signed)
Asked by Dr. Henderson Cloud to provide prenatal consultation for patient at risk for preterm delivery due to preterm labor and cervical dilatation.  Mother is 33y.o. G5 P 1-0-3-1 who is now 25 6/[redacted] weeks EGA.  She was admitted yesterday after onset of contractions and has since dilated to 3 cm.  She is on Mag sulfate tocolysis and being treated with betamethasone (will get 2nd dose about 11pm tonight) and antibiotics (azithromycin, clindamycin, cefazolin).  Mother was nauseous and flushed when I spoke with her but stated she was anxious to talk with me and was able to converse. Her parents were also present.  I discussed usual expectations for preterm infant at 62 - [redacted] weeks gestation, including possible needs for DR resuscitation, respiratory support, IV access.  Also presented risks of death or serious morbidity, including neurodevelopmental handicap.  Projected possible length of stay in NICU until 36 - [redacted] wks EGA.  Discussed advantages of feeding with mother's milk, and encouraged her to consider pumping postnatally.  Patient was attentive, had appropriate questions, and was appreciative of my input.  Thank you for the consultation.

## 2012-06-19 NOTE — Progress Notes (Signed)
Patient reports two ctx since this am.  Feeling tired with Mag.  No SOB.  VSS FHT reactive  32yo at [redacted]w[redacted]d with PTL -D/C Mag to 2g/hr

## 2012-06-19 NOTE — Progress Notes (Signed)
Cx 3/C/BBOW between UCs FHT + accels UCs about q 3-4 min DTR 3+ without clonus  Bedside U/S by me = vtx Radiology U/S report pending    VSS afeb  Results for orders placed during the hospital encounter of 06/18/12 (from the past 24 hour(s))  URINALYSIS, ROUTINE W REFLEX MICROSCOPIC     Status: Abnormal   Collection Time    06/18/12  7:25 PM      Result Value Range   Color, Urine YELLOW  YELLOW   APPearance CLEAR  CLEAR   Specific Gravity, Urine <1.005 (*) 1.005 - 1.030   pH 7.0  5.0 - 8.0   Glucose, UA NEGATIVE  NEGATIVE mg/dL   Hgb urine dipstick NEGATIVE  NEGATIVE   Bilirubin Urine NEGATIVE  NEGATIVE   Ketones, ur NEGATIVE  NEGATIVE mg/dL   Protein, ur NEGATIVE  NEGATIVE mg/dL   Urobilinogen, UA 0.2  0.0 - 1.0 mg/dL   Nitrite NEGATIVE  NEGATIVE   Leukocytes, UA NEGATIVE  NEGATIVE  FETAL FIBRONECTIN     Status: Abnormal   Collection Time    06/18/12  8:20 PM      Result Value Range   Fetal Fibronectin POSITIVE (*) NEGATIVE  COMPREHENSIVE METABOLIC PANEL     Status: Abnormal   Collection Time    06/18/12 10:50 PM      Result Value Range   Sodium 135  135 - 145 mEq/L   Potassium 3.1 (*) 3.5 - 5.1 mEq/L   Chloride 105  96 - 112 mEq/L   CO2 21  19 - 32 mEq/L   Glucose, Bld 97  70 - 99 mg/dL   BUN 5 (*) 6 - 23 mg/dL   Creatinine, Ser 9.14  0.50 - 1.10 mg/dL   Calcium 8.9  8.4 - 78.2 mg/dL   Total Protein 5.8 (*) 6.0 - 8.3 g/dL   Albumin 2.8 (*) 3.5 - 5.2 g/dL   AST 12  0 - 37 U/L   ALT 10  0 - 35 U/L   Alkaline Phosphatase 80  39 - 117 U/L   Total Bilirubin 0.2 (*) 0.3 - 1.2 mg/dL   GFR calc non Af Amer >90  >90 mL/min   GFR calc Af Amer >90  >90 mL/min  URIC ACID     Status: None   Collection Time    06/18/12 10:50 PM      Result Value Range   Uric Acid, Serum 3.3  2.4 - 7.0 mg/dL  CBC     Status: Abnormal   Collection Time    06/18/12 10:50 PM      Result Value Range   WBC 15.6 (*) 4.0 - 10.5 K/uL   RBC 3.75 (*) 3.87 - 5.11 MIL/uL   Hemoglobin 11.7 (*) 12.0  - 15.0 g/dL   HCT 95.6 (*) 21.3 - 08.6 %   MCV 91.7  78.0 - 100.0 fL   MCH 31.2  26.0 - 34.0 pg   MCHC 34.0  30.0 - 36.0 g/dL   RDW 57.8  46.9 - 62.9 %   Platelets 177  150 - 400 K/uL  A: PTL P: increase magnesium to 4 gm/hr     Terbutaline 0.25mg  SQ x 1     D/W patient and husband above, prematurity

## 2012-06-19 NOTE — Progress Notes (Signed)
No ctx since 2am.  +FM.  No SOB.  No LOF or VB.    VSS.  AF Gen: A&O x 3 Abd: soft, nontender, gravid Ext: +SCDs  32yo [id number] at [redacted]w[redacted]d with PTL -Arrested with Mag decreased this am from 4->3g/hr.  Will further decrease if stays quiet, continue through steroid maturity, and add Indocin prn -BMZ #2 due today -U/S with vtx presentation.  Patient has h/o term C/S and was counseled about TOL.  She would like TOL if still vtx in labor -NICU consult -May have regular diet -SCDs for DVT ppx -Will add latency abx for exposed membranes

## 2012-06-19 NOTE — Progress Notes (Signed)
Request to decrease Magnesium Sulfate, per MD decrease down to 3 gram/hr

## 2012-06-19 NOTE — Progress Notes (Signed)
This note also relates to the following rows which could not be included: Pulse Rate - Cannot attach notes to unvalidated device data SpO2 - Cannot attach notes to unvalidated device data   

## 2012-06-20 NOTE — Progress Notes (Signed)
Pt reports approx 1 ctx/hour.  No vb or lof.  + FM.  Mild HA with magnesium.  No CP/SOB  AF, VSS UOP 50cc/hr Gen - NAD Abd - gravid, NT Ext - NT, no edema  A/P:  PTL Continue mag through steroid time  Continue IV abx for exposed membranes Continue hospital bedrest - d/w pt long term inpt mngt (may reassess at 28wks) vtx by last [place]

## 2012-06-21 LAB — CULTURE, BETA STREP (GROUP B ONLY)

## 2012-06-21 MED ORDER — INDOMETHACIN 25 MG PO CAPS
25.0000 mg | ORAL_CAPSULE | Freq: Three times a day (TID) | ORAL | Status: DC
  Administered 2012-06-21 – 2012-06-23 (×6): 25 mg via ORAL
  Filled 2012-06-21 (×7): qty 1

## 2012-06-21 MED ORDER — BUTORPHANOL TARTRATE 1 MG/ML IJ SOLN
1.0000 mg | Freq: Once | INTRAMUSCULAR | Status: AC
  Administered 2012-06-21: 1 mg via INTRAVENOUS

## 2012-06-21 MED ORDER — TERBUTALINE SULFATE 1 MG/ML IJ SOLN
INTRAMUSCULAR | Status: AC
  Filled 2012-06-21: qty 1

## 2012-06-21 MED ORDER — TERBUTALINE SULFATE 1 MG/ML IJ SOLN
0.2500 mg | Freq: Once | INTRAMUSCULAR | Status: AC
  Administered 2012-06-21: 0.25 mg via SUBCUTANEOUS

## 2012-06-21 MED ORDER — INDOMETHACIN 50 MG PO CAPS
50.0000 mg | ORAL_CAPSULE | Freq: Once | ORAL | Status: AC
  Administered 2012-06-21: 50 mg via ORAL
  Filled 2012-06-21: qty 1

## 2012-06-21 MED ORDER — TERBUTALINE SULFATE 1 MG/ML IJ SOLN
0.2500 mg | Freq: Once | INTRAMUSCULAR | Status: AC
  Administered 2012-06-21: 0.25 mg via SUBCUTANEOUS
  Filled 2012-06-21: qty 1

## 2012-06-21 MED ORDER — INDOMETHACIN 25 MG PO CAPS
25.0000 mg | ORAL_CAPSULE | Freq: Three times a day (TID) | ORAL | Status: DC
  Filled 2012-06-21: qty 1

## 2012-06-21 NOTE — Progress Notes (Signed)
Pt c/o increasing pain overnight q42min.  No vb or lof.  Pt given terbutaline x 1 overnight with good results until 545 am. No starting to feel increasing pain with ctx again.    AF, VSS + FHT, reassuring Toco - difficult to trace Gen - uncomfortable w/ ctx Abd - gravid, NT Ext - NT, no edema Cvx 3-4/c/0  A/P:  Will start indocin Stadol x 1 given Will d/c mag once  S/p BMZ Plan for vbac rediscussed

## 2012-06-22 LAB — TYPE AND SCREEN: Antibody Screen: NEGATIVE

## 2012-06-22 MED ORDER — TERBUTALINE SULFATE 1 MG/ML IJ SOLN
0.2500 mg | Freq: Once | INTRAMUSCULAR | Status: AC
  Administered 2012-06-22: 0.25 mg via SUBCUTANEOUS

## 2012-06-22 MED ORDER — POLYETHYLENE GLYCOL 3350 17 G PO PACK
17.0000 g | PACK | Freq: Every day | ORAL | Status: DC
  Administered 2012-06-22: 17 g via ORAL
  Filled 2012-06-22 (×2): qty 1

## 2012-06-22 MED ORDER — NIFEDIPINE 10 MG PO CAPS
10.0000 mg | ORAL_CAPSULE | Freq: Four times a day (QID) | ORAL | Status: DC
  Administered 2012-06-22 – 2012-06-23 (×4): 10 mg via ORAL
  Filled 2012-06-22 (×4): qty 1

## 2012-06-22 MED ORDER — NIFEDIPINE 10 MG PO CAPS
10.0000 mg | ORAL_CAPSULE | Freq: Once | ORAL | Status: AC
  Administered 2012-06-22: 10 mg via ORAL
  Filled 2012-06-22: qty 1

## 2012-06-22 NOTE — Progress Notes (Signed)
Pt feeling much better this morning.  No ctx, vb or lof.  + FM  Af, vss  + FHT gen - NAD Abd - gravid, NT Cvx - deferred vtx by bedside ultrasound 06/21/12  A/P:  PTL S/p BMZ D/C Mag & start procardia Continue indocin Continue abx (for total 7d)

## 2012-06-23 ENCOUNTER — Inpatient Hospital Stay (HOSPITAL_COMMUNITY): Payer: BC Managed Care – PPO | Admitting: Anesthesiology

## 2012-06-23 ENCOUNTER — Encounter (HOSPITAL_COMMUNITY): Payer: Self-pay | Admitting: Anesthesiology

## 2012-06-23 LAB — CBC
HCT: 30.9 % — ABNORMAL LOW (ref 36.0–46.0)
Hemoglobin: 10.3 g/dL — ABNORMAL LOW (ref 12.0–15.0)
MCV: 92 fL (ref 78.0–100.0)
RBC: 3.36 MIL/uL — ABNORMAL LOW (ref 3.87–5.11)
WBC: 11.1 10*3/uL — ABNORMAL HIGH (ref 4.0–10.5)

## 2012-06-23 MED ORDER — OXYTOCIN 40 UNITS IN LACTATED RINGERS INFUSION - SIMPLE MED: 1.0000 m[IU]/min | INTRAVENOUS | Status: DC

## 2012-06-23 MED ORDER — LIDOCAINE HCL (PF) 1 % IJ SOLN
INTRAMUSCULAR | Status: DC | PRN
  Administered 2012-06-23 (×2): 8 mL

## 2012-06-23 MED ORDER — EPHEDRINE 5 MG/ML INJ
10.0000 mg | INTRAVENOUS | Status: DC | PRN
  Filled 2012-06-23: qty 4
  Filled 2012-06-23: qty 2

## 2012-06-23 MED ORDER — CLINDAMYCIN PHOSPHATE 900 MG/50ML IV SOLN
900.0000 mg | Freq: Once | INTRAVENOUS | Status: DC
  Filled 2012-06-23: qty 50

## 2012-06-23 MED ORDER — BENZOCAINE-MENTHOL 20-0.5 % EX AERO
1.0000 "application " | INHALATION_SPRAY | CUTANEOUS | Status: DC | PRN
  Filled 2012-06-23: qty 56

## 2012-06-23 MED ORDER — PHENYLEPHRINE 40 MCG/ML (10ML) SYRINGE FOR IV PUSH (FOR BLOOD PRESSURE SUPPORT)
80.0000 ug | PREFILLED_SYRINGE | INTRAVENOUS | Status: DC | PRN
  Filled 2012-06-23: qty 2

## 2012-06-23 MED ORDER — LACTATED RINGERS IV SOLN: INTRAVENOUS | Status: DC

## 2012-06-23 MED ORDER — SIMETHICONE 80 MG PO CHEW
80.0000 mg | CHEWABLE_TABLET | ORAL | Status: DC | PRN
  Administered 2012-06-23: 80 mg via ORAL

## 2012-06-23 MED ORDER — ACETAMINOPHEN 325 MG PO TABS: 650.0000 mg | ORAL_TABLET | ORAL | Status: DC | PRN

## 2012-06-23 MED ORDER — OXYCODONE-ACETAMINOPHEN 5-325 MG PO TABS
1.0000 | ORAL_TABLET | Freq: Four times a day (QID) | ORAL | Status: DC | PRN
  Administered 2012-06-23 – 2012-06-24 (×4): 1 via ORAL
  Filled 2012-06-23 (×5): qty 1

## 2012-06-23 MED ORDER — MEASLES, MUMPS & RUBELLA VAC ~~LOC~~ INJ: 0.5000 mL | INJECTION | Freq: Once | SUBCUTANEOUS | Status: DC

## 2012-06-23 MED ORDER — IBUPROFEN 800 MG PO TABS
800.0000 mg | ORAL_TABLET | Freq: Three times a day (TID) | ORAL | Status: DC | PRN
  Administered 2012-06-23 – 2012-06-25 (×4): 800 mg via ORAL
  Filled 2012-06-23 (×4): qty 1

## 2012-06-23 MED ORDER — BUTORPHANOL TARTRATE 1 MG/ML IJ SOLN
1.0000 mg | Freq: Once | INTRAMUSCULAR | Status: AC
  Administered 2012-06-23: 1 mg via INTRAVENOUS
  Filled 2012-06-23: qty 1

## 2012-06-23 MED ORDER — DIBUCAINE 1 % RE OINT: 1.0000 "application " | TOPICAL_OINTMENT | RECTAL | Status: DC | PRN

## 2012-06-23 MED ORDER — NIFEDIPINE 10 MG PO CAPS
20.0000 mg | ORAL_CAPSULE | ORAL | Status: DC
  Administered 2012-06-23: 20 mg via ORAL
  Filled 2012-06-23: qty 2

## 2012-06-23 MED ORDER — OXYCODONE-ACETAMINOPHEN 5-325 MG PO TABS: 1.0000 | ORAL_TABLET | ORAL | Status: DC | PRN

## 2012-06-23 MED ORDER — PRENATAL MULTIVITAMIN CH
1.0000 | ORAL_TABLET | Freq: Every day | ORAL | Status: DC
  Administered 2012-06-23 – 2012-06-25 (×3): 1 via ORAL
  Filled 2012-06-23 (×3): qty 1

## 2012-06-23 MED ORDER — FLEET ENEMA 7-19 GM/118ML RE ENEM: 1.0000 | ENEMA | Freq: Every day | RECTAL | Status: DC | PRN

## 2012-06-23 MED ORDER — WITCH HAZEL-GLYCERIN EX PADS
1.0000 "application " | MEDICATED_PAD | CUTANEOUS | Status: DC | PRN
  Administered 2012-06-24: 1 via TOPICAL

## 2012-06-23 MED ORDER — ONDANSETRON HCL 4 MG PO TABS: 4.0000 mg | ORAL_TABLET | ORAL | Status: DC | PRN

## 2012-06-23 MED ORDER — LACTATED RINGERS IV SOLN
500.0000 mL | Freq: Once | INTRAVENOUS | Status: AC
  Administered 2012-06-23: 500 mL via INTRAVENOUS

## 2012-06-23 MED ORDER — FENTANYL 2.5 MCG/ML BUPIVACAINE 1/10 % EPIDURAL INFUSION (WH - ANES)
14.0000 mL/h | INTRAMUSCULAR | Status: DC | PRN
  Filled 2012-06-23: qty 125

## 2012-06-23 MED ORDER — TERBUTALINE SULFATE 1 MG/ML IJ SOLN: 0.2500 mg | Freq: Once | INTRAMUSCULAR | Status: DC | PRN

## 2012-06-23 MED ORDER — SENNOSIDES-DOCUSATE SODIUM 8.6-50 MG PO TABS
2.0000 | ORAL_TABLET | Freq: Every day | ORAL | Status: DC
  Administered 2012-06-23 – 2012-06-24 (×2): 2 via ORAL

## 2012-06-23 MED ORDER — EPHEDRINE 5 MG/ML INJ
10.0000 mg | INTRAVENOUS | Status: DC | PRN
  Filled 2012-06-23: qty 2

## 2012-06-23 MED ORDER — CITRIC ACID-SODIUM CITRATE 334-500 MG/5ML PO SOLN: 30.0000 mL | ORAL | Status: DC | PRN

## 2012-06-23 MED ORDER — LIDOCAINE HCL (PF) 1 % IJ SOLN
30.0000 mL | INTRAMUSCULAR | Status: AC | PRN
  Administered 2012-06-23: 30 mL via SUBCUTANEOUS
  Filled 2012-06-23 (×2): qty 30

## 2012-06-23 MED ORDER — ZOLPIDEM TARTRATE 5 MG PO TABS: 5.0000 mg | ORAL_TABLET | Freq: Every evening | ORAL | Status: DC | PRN

## 2012-06-23 MED ORDER — OXYTOCIN BOLUS FROM INFUSION
500.0000 mL | INTRAVENOUS | Status: DC
  Administered 2012-06-23: 500 mL via INTRAVENOUS

## 2012-06-23 MED ORDER — LACTATED RINGERS IV SOLN
500.0000 mL | INTRAVENOUS | Status: DC | PRN
  Administered 2012-06-23: 300 mL via INTRAVENOUS

## 2012-06-23 MED ORDER — OXYTOCIN 40 UNITS IN LACTATED RINGERS INFUSION - SIMPLE MED
1.0000 m[IU]/min | INTRAVENOUS | Status: DC
  Administered 2012-06-23: 2 m[IU]/min via INTRAVENOUS

## 2012-06-23 MED ORDER — TETANUS-DIPHTH-ACELL PERTUSSIS 5-2.5-18.5 LF-MCG/0.5 IM SUSP: 0.5000 mL | Freq: Once | INTRAMUSCULAR | Status: DC

## 2012-06-23 MED ORDER — PHENYLEPHRINE 40 MCG/ML (10ML) SYRINGE FOR IV PUSH (FOR BLOOD PRESSURE SUPPORT)
80.0000 ug | PREFILLED_SYRINGE | INTRAVENOUS | Status: DC | PRN
  Filled 2012-06-23: qty 5
  Filled 2012-06-23: qty 2

## 2012-06-23 MED ORDER — FENTANYL 2.5 MCG/ML BUPIVACAINE 1/10 % EPIDURAL INFUSION (WH - ANES)
INTRAMUSCULAR | Status: DC | PRN
  Administered 2012-06-23: 14 mL/h via EPIDURAL

## 2012-06-23 MED ORDER — OXYTOCIN 40 UNITS IN LACTATED RINGERS INFUSION - SIMPLE MED
62.5000 mL/h | INTRAVENOUS | Status: DC
  Filled 2012-06-23: qty 1000

## 2012-06-23 MED ORDER — ONDANSETRON HCL 4 MG/2ML IJ SOLN: 4.0000 mg | Freq: Four times a day (QID) | INTRAMUSCULAR | Status: DC | PRN

## 2012-06-23 MED ORDER — IBUPROFEN 600 MG PO TABS
600.0000 mg | ORAL_TABLET | Freq: Four times a day (QID) | ORAL | Status: DC | PRN
  Administered 2012-06-23: 600 mg via ORAL
  Filled 2012-06-23: qty 1

## 2012-06-23 MED ORDER — BISACODYL 10 MG RE SUPP: 10.0000 mg | Freq: Every day | RECTAL | Status: DC | PRN

## 2012-06-23 MED ORDER — ONDANSETRON HCL 4 MG/2ML IJ SOLN: 4.0000 mg | INTRAMUSCULAR | Status: DC | PRN

## 2012-06-23 MED ORDER — DIPHENHYDRAMINE HCL 50 MG/ML IJ SOLN
12.5000 mg | INTRAMUSCULAR | Status: DC | PRN
Start: 2012-06-23 — End: 2012-06-23

## 2012-06-23 MED ORDER — DIPHENHYDRAMINE HCL 25 MG PO CAPS: 25.0000 mg | ORAL_CAPSULE | Freq: Four times a day (QID) | ORAL | Status: DC | PRN

## 2012-06-23 MED ORDER — LANOLIN HYDROUS EX OINT: TOPICAL_OINTMENT | CUTANEOUS | Status: DC | PRN

## 2012-06-23 NOTE — Anesthesia Preprocedure Evaluation (Addendum)
Anesthesia Evaluation  Patient identified by MRN, date of birth, ID band Patient awake    Reviewed: Allergy & Precautions, H&P , Patient's Chart, lab work & pertinent test results  Airway Mallampati: I TM Distance: >3 FB Neck ROM: full    Dental no notable dental hx.    Pulmonary neg pulmonary ROS,    Pulmonary exam normal       Cardiovascular negative cardio ROS      Neuro/Psych negative neurological ROS     GI/Hepatic negative GI ROS, Neg liver ROS,   Endo/Other  negative endocrine ROS  Renal/GU negative Renal ROS  negative genitourinary   Musculoskeletal negative musculoskeletal ROS (+)   Abdominal Normal abdominal exam  (+)   Peds  Hematology negative hematology ROS (+)   Anesthesia Other Findings   Reproductive/Obstetrics (+) Pregnancy                          Anesthesia Physical Anesthesia Plan  ASA: II  Anesthesia Plan: Epidural   Post-op Pain Management:    Induction:   Airway Management Planned:   Additional Equipment:   Intra-op Plan:   Post-operative Plan:   Informed Consent: I have reviewed the patients History and Physical, chart, labs and discussed the procedure including the risks, benefits and alternatives for the proposed anesthesia with the patient or authorized representative who has indicated his/her understanding and acceptance.     Plan Discussed with:   Anesthesia Plan Comments:        Anesthesia Quick Evaluation

## 2012-06-23 NOTE — Progress Notes (Signed)
Last night, pt began to have painful contractions again.  Responded to terb & stadol x 1. However,  630am this am, contractions restarted.  Painful and regular Q91min.  Minimal relief with stadol.    + FHT Toco - unable to trace Cvx 6-7/c/0, vtx  A/P: PTL - persistant contractions despite procardia, indocin, stadol, terbutaline  Will transfer to L&D Epidural  Exp mngt

## 2012-06-23 NOTE — Progress Notes (Signed)
Ur chart review completed.  

## 2012-06-23 NOTE — Anesthesia Procedure Notes (Signed)
Epidural Patient location during procedure: OB Start time: 06/23/2012 8:15 AM End time: 06/23/2012 8:19 AM  Staffing Anesthesiologist: Sandrea Hughs Performed by: anesthesiologist   Preanesthetic Checklist Completed: patient identified, site marked, surgical consent, pre-op evaluation, timeout performed, IV checked, risks and benefits discussed and monitors and equipment checked  Epidural Patient position: sitting Prep: site prepped and draped and DuraPrep Patient monitoring: continuous pulse ox and blood pressure Approach: midline Injection technique: LOR air  Needle:  Needle type: Tuohy  Needle gauge: 17 G Needle length: 9 cm and 9 Needle insertion depth: 5 cm cm Catheter type: closed end flexible Catheter size: 19 Gauge Catheter at skin depth: 10 cm Test dose: negative and Other  Assessment Sensory level: T9 Events: blood not aspirated, injection not painful, no injection resistance, negative IV test and no paresthesia  Additional Notes Reason for block:procedure for pain

## 2012-06-24 LAB — HEPATITIS B SURFACE ANTIGEN: Hepatitis B Surface Ag: NEGATIVE

## 2012-06-24 LAB — CBC
MCH: 30.8 pg (ref 26.0–34.0)
MCHC: 32.6 g/dL (ref 30.0–36.0)
Platelets: 168 10*3/uL (ref 150–400)
RDW: 14.3 % (ref 11.5–15.5)

## 2012-06-24 MED ORDER — SERTRALINE HCL 50 MG PO TABS
50.0000 mg | ORAL_TABLET | Freq: Every day | ORAL | Status: DC
  Administered 2012-06-24 – 2012-06-25 (×2): 50 mg via ORAL
  Filled 2012-06-24 (×3): qty 1

## 2012-06-24 NOTE — Anesthesia Postprocedure Evaluation (Signed)
  Anesthesia Post-op Note  Patient: [name]  Procedure(s) Performed: * No procedures listed *  Patient Location: PACU and Mother/Baby  Anesthesia Type:Epidural  Level of Consciousness: awake, alert  and oriented  Airway and Oxygen Therapy: Patient Spontanous Breathing  Post-op Pain: none  Post-op Assessment: Post-op Vital signs reviewed, Patient's Cardiovascular Status Stable, No headache, No backache, No residual numbness and No residual motor weakness  Post-op Vital Signs: Reviewed and stable  Complications: No apparent anesthesia complications

## 2012-06-24 NOTE — Clinical Social Work Maternal (Signed)
Clinical Social Work Department PSYCHOSOCIAL ASSESSMENT - MATERNAL/CHILD 06/24/2012  Patient:  Furukawa,Demia E  Account Number:  [id number]  Admit Date:  06/18/2012  Childs Name:   Lucy Caroline Garmany    Clinical Social Worker:  Nancylee Gaines, LCSW   Date/Time:  06/24/2012 11:00 AM  Date Referred:  06/24/2012   Referral source  NICU     Referred reason  NICU   Other referral source:    I:  FAMILY / HOME ENVIRONMENT Child's legal guardian:  PARENT  Guardian - Name Guardian - Age Guardian - Address  Keron Mentzel [address] [place], [place] [address]  Peter Hardage 34 same   Other household support members/support persons Name Relationship DOB  Graem Brasil SON 12/30/10   Other support:   Parents state they have a great support system of family and friends.  MOB's parents live in [place] and FOB's parents are in [place].    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    III  STRENGTHS Strengths  Adequate Resources  Compliance with medical plan  Home prepared for Child (including basic supplies)  Supportive family/friends  Understanding of illness   Strength comment:    IV  RISK FACTORS AND CURRENT PROBLEMS Current Problem:  YES   Risk Factor & Current Problem Patient Issue Family Issue Risk Factor / Current Problem Comment  Mental Illness Y N MOB-Anx/Dep   N N     V  SOCIAL WORK ASSESSMENT  CSW met with parents in MOB's third floor room/306 to introduce myself, complete assessment due to NICU admission, and offer support.  Parents were extremely pleasant and welcomed CSW into their room.  They appear to be  in good spirits, although MOB admits that things still feel "surreal."  CSW had a lenghty conversation about what to expect during baby's NICU hospitalization, in general terms, as well as common emotions related to the experience and recommendations on addressing them.  MOB was very open about her PPD with her first child.  She states she is taking 25mg of Zoloft at this time and that although she feels she is coping well at this time, she is concerned about symptoms of depression in the coming weeks/months.  CSW recommends talking with her doctor about increasing her dose, given the current situation and her hx of PPD with her first child.  MOB states she is worried about the safety of taking such medication while breast feeding.  CSW discussed the importance of her mental/emotional stability and also that taking Zoloft while breast feeding is safe.  However, CSW encouraged her to talk with her doctor and or lactation consultant regarding her concerns.  MOB's OB/Dr. Morris came in to see her while CSW was still in the room.  MOB discussed the possibility of increasing her antidepressant medication, and Dr. Morris was in agreement.  MOB will increase to 50mg and will monitor her emotions to see if this is a therapeutic dose for her.  MOB states she has a counselor, Judith Bobo, in [place], whom she plans to continue to see.  CSW encouraged her to call her counselor if she is feeling the   need to process her feelings, but also asked her to call CSW at any time as well.  She seemed very appreciative of CSW's concern for her emotional wellbeing.  She was appropriately tearful at times during our discussion and CSW validated her feelings and encouraged her to allow herself to cry, adding that the day of her discharge will be especially emotional.  CSW also informed parents of Family Support Network and encouraged them to get involved with support programs offered to NICU parents.  Parents state they have not begun  gathering baby items, which CSW states is understandable given baby's premature arrival.  CSW asked them to let CSW know if they are having trouble getting necessary items and advised them to wait until closer to discharge to get a car seat for baby.  CSW asked parents to let CSW know if they would like some assistance with gas cards since they will be traveling back and forth from [place].  They accepted and thanked CSW.  CSW informed them that they may call CSW at any time if they would like to have a family conference with baby's medical team.  CSW also informed them not to be concerned if CSW contacts them to schedule a family conference, to ensure open lines of communication between family and medical staff.  CSW explained baby's eligibility for SSI and parents are interested in completing the application.  They were appreciative of this information as well.  CSW assisted them in completing paperwork and will submit application once Mother's Verification of Facts has been completed by the Birth Registrar.  CSW has no social concerns at this time and is available for emotional support and assistance as needed.     VI SOCIAL WORK PLAN Social Work Plan  Psychosocial Support/Ongoing Assessment of Needs   Type of pt/family education:   Common emotions related to the NICU experience  What to expect from a NICU admission (in general terms) Ongoing support services offered by NICU CSW   If child protective services report - county:   If child protective services report - date:   Information/referral to community resources comment:   No referral needs noted at this time.  Staff will make referrals as needed as baby gets close to discharge.   Other social work plan:    

## 2012-06-24 NOTE — Progress Notes (Signed)
Post Partum Day 1 Subjective: no complaints, up ad lib, voiding and tolerating PO, baby stable in NICU on vent  Objective: Blood pressure 91/53, pulse 55, temperature 97.9 F (36.6 C), temperature source Oral, resp. rate 16, height 5\' 1"  (1.549 m), weight 127 lb (57.607 kg), last menstrual period 07/22/2011, SpO2 99.00%, unknown if currently breastfeeding.  Physical Exam:  General: alert and cooperative Lochia: appropriate Uterine Fundus: firm Incision: perineum intact DVT Evaluation: No evidence of DVT seen on physical exam. Negative Homan's sign. No cords or calf tenderness. No significant calf/ankle edema.   Recent Labs  06/23/12 0730 06/24/12 0515  HGB 10.3* 9.2*  HCT 30.9* 28.2*    Assessment/Plan: Plan for discharge tomorrow   LOS: 6 days   Lyn Deemer G 06/24/2012, 8:58 AM

## 2012-06-25 ENCOUNTER — Encounter (HOSPITAL_COMMUNITY)
Admit: 2012-06-25 | Discharge: 2012-06-25 | Disposition: A | Discharge status: Home / Self Care | Payer: BC Managed Care – PPO | Attending: Obstetrics and Gynecology | Admitting: Obstetrics and Gynecology

## 2012-06-25 MED ORDER — SERTRALINE HCL 50 MG PO TABS: 50.0000 mg | ORAL_TABLET | Freq: Every day | ORAL | Status: DC

## 2012-06-25 NOTE — Progress Notes (Signed)
Pt is discharged in the care of husband. Downstairs per ambulatory. Stable. Denies any pain or discomfort, or heavy vaginal bleeding. Infant to remain in NICU. Discharge instructions with Rx were given to pt. Questions were asked and answered.Marland Kitchen

## 2012-06-25 NOTE — Progress Notes (Signed)
CSW checked in with MOB to see how she is doing on her day of discharge.  MOB was in great spirits, and states that she is fine now, but might not be when she has to start packing her room.  CSW validated these feelings and encouraged her to take her time with leaving today.

## 2012-06-25 NOTE — Discharge Summary (Signed)
Obstetric Discharge Summary Reason for Admission: Preterm labor Prenatal Procedures: ultrasound Intrapartum Procedures: spontaneous vaginal delivery Postpartum Procedures: none Complications-Operative and Postpartum: none Hemoglobin  Date Value Range Status  06/24/2012 9.2* 12.0 - 15.0 g/dL Final     HCT  Date Value Range Status  06/24/2012 28.2* 36.0 - 46.0 % Final    Physical Exam:  General: alert and cooperative Lochia: appropriate Uterine Fundus: firm Incision: perineum intact DVT Evaluation: No evidence of DVT seen on physical exam. Negative Homan's sign. No cords or calf tenderness. No significant calf/ankle edema.  Discharge Diagnoses: Premature labor  Discharge Information: Date: 06/25/2012 Activity: pelvic rest Diet: routine Medications: PNV, Ibuprofen and zoloft Condition: stable Instructions: refer to practice specific booklet Discharge to: home   Newborn Data: Live born female  Birth Weight: 1 lb 12.9 oz (820 g) APGAR: 5, 8  Home with NICU.  Sajjad Honea G 06/25/2012, 9:02 AM

## 2012-07-10 ENCOUNTER — Ambulatory Visit: Payer: Self-pay

## 2012-07-10 NOTE — Lactation Note (Signed)
This note was copied from the chart of [name]. Lactation Consultation Note     Follow up consult with this mom, now 2 weeks post partum. Her milk supply is good, 90 mls every 3 hours. She reports sore nipples. I increased her to slize 30 flanges, which mom said felt much better, and she could tolerate much higher suction. I also gave mom comfort gels. I will follow this family in the NICU  Patient Name: [name] [id number] Date: 07/10/2012 Reason for consult: Follow-up assessment;NICU baby   Maternal Data    Feeding    LATCH Score/Interventions                      Lactation Tools Discussed/Used Tools: Comfort gels;Flanges Flange Size: 30   Consult Status Consult Status: PRN Follow-up type:  (in NICU)    Alfred Levins 07/10/2012, 4:18 PM

## 2012-10-08 ENCOUNTER — Ambulatory Visit (INDEPENDENT_AMBULATORY_CARE_PROVIDER_SITE_OTHER): Payer: BC Managed Care – PPO | Admitting: Adult Health

## 2012-10-08 ENCOUNTER — Encounter: Payer: Self-pay | Admitting: Adult Health

## 2012-10-08 VITALS — BP 102/58 | HR 88 | Temp 98.3°F | Resp 12 | Wt 116.5 lb

## 2012-10-08 DIAGNOSIS — J329 Chronic sinusitis, unspecified: Secondary | ICD-10-CM | POA: Insufficient documentation

## 2012-10-08 MED ORDER — AZITHROMYCIN 250 MG PO TABS: ORAL_TABLET | ORAL | Status: DC

## 2012-10-08 NOTE — Assessment & Plan Note (Signed)
Symptoms > 2 weeks. Allergic to PCN. Start Azithromycin. Use OTC cough medication such as robitussin or delsym. Instructed to avoid any cough medication with alcohol since she is breast feeding.

## 2012-10-08 NOTE — Progress Notes (Signed)
  Subjective:    Patient ID: [name], female    DOB: 23-Sep-1980, 32 y.o.   MRN: [id number]  HPI  Pt reports cough and congestion for 2 weeks.  Pt states she thought it would go away but became worse yesterday.  Pt states her throat began hurting and she developed pressure in bilateral ears yesterday.  Pt states she has sinus drainage that she is coughing up, unsure of color.  Pt is bringing her premature infant home tomorrow and wants to make sure she isn't contagious.  Pt reports chills but unsure if she's had fever.    Current Outpatient Prescriptions on File Prior to Visit  Medication Sig Dispense Refill  . acetaminophen (TYLENOL) 500 MG tablet Take 1,000 mg by mouth every 6 (six) hours as needed for pain.      . Prenatal Vit-Fe Fumarate-FA (PRENATAL MULTIVITAMIN) TABS Take 1 tablet by mouth at bedtime.       No current facility-administered medications on file prior to visit.    Review of Systems  Constitutional: Positive for chills.  HENT: Positive for ear pain, sore throat and postnasal drip.        Sore throat and ear pressure since yesterday.  Eyes: Negative.   Respiratory: Positive for cough.   Cardiovascular: Negative.   Skin: Negative.   Allergic/Immunologic: Negative.   Neurological: Negative.   Psychiatric/Behavioral: Negative.        Objective:   Physical Exam  Constitutional: She is oriented to person, place, and time. She appears well-developed and well-nourished.  HENT:  Mouth/Throat: Posterior oropharyngeal erythema present.  Eyes: Conjunctivae are normal. Pupils are equal, round, and reactive to light.  Neck: Normal range of motion.  Cardiovascular: Normal rate and regular rhythm.   Pulmonary/Chest: Effort normal and breath sounds normal.  Neurological: She is alert and oriented to person, place, and time.  Skin: Skin is warm and dry.  Psychiatric: She has a normal mood and affect. Her behavior is normal. Judgment and thought content normal.    BP  102/58  Pulse 88  Temp(Src) 98.3 F (36.8 C) (Oral)  Resp 12  Wt 116 lb 8 oz (52.844 kg)  BMI 22.02 kg/m2  SpO2 97%  Breastfeeding? Yes     Assessment & Plan:

## 2012-10-08 NOTE — Patient Instructions (Signed)
  Start azithromycin today. Take 2 tablets today then one tablet daily for the next 4 days.  For your cough you may take Robitussin or Delsym. Make sure there is no alcohol in the ingredients.  Please call the office if your symptoms are not improved within the next 3-4 days.

## 2012-11-20 ENCOUNTER — Ambulatory Visit (INDEPENDENT_AMBULATORY_CARE_PROVIDER_SITE_OTHER): Payer: BC Managed Care – PPO | Admitting: Adult Health

## 2012-11-20 ENCOUNTER — Encounter: Payer: Self-pay | Admitting: Adult Health

## 2012-11-20 ENCOUNTER — Other Ambulatory Visit: Payer: Self-pay

## 2012-11-20 VITALS — BP 104/66 | HR 100 | Temp 99.0°F | Resp 12 | Wt 119.5 lb

## 2012-11-20 DIAGNOSIS — J069 Acute upper respiratory infection, unspecified: Secondary | ICD-10-CM

## 2012-11-20 DIAGNOSIS — R52 Pain, unspecified: Secondary | ICD-10-CM

## 2012-11-20 LAB — POCT INFLUENZA A/B
Influenza A, POC: NEGATIVE
Influenza B, POC: NEGATIVE

## 2012-11-20 NOTE — Progress Notes (Signed)
Pre-visit discussion using our clinic review tool. No additional management support is needed unless otherwise documented below in the visit note.  

## 2012-11-20 NOTE — Progress Notes (Signed)
  Subjective:    Patient ID: [name], female    DOB: 06/01/1980, 32 y.o.   MRN: [id number]  HPI  Patient is a pleasant 32 year old female who presents to clinic with complaints of chills, low-grade fever, body aches which began yesterday. She reports that symptoms came on rather quickly. She also reports that her GYN started her on Keflex for possible breast infection. Patient is breast-feeding so she was started on antibiotic for possible mastitis.  Current Outpatient Prescriptions on File Prior to Visit  Medication Sig Dispense Refill  . omeprazole (PRILOSEC OTC) 20 MG tablet Take 20 mg by mouth daily.      . Prenatal Vit-Fe Fumarate-FA (PRENATAL MULTIVITAMIN) TABS Take 1 tablet by mouth at bedtime.      . sertraline (ZOLOFT) 100 MG tablet Take 150 mg by mouth daily.       No current facility-administered medications on file prior to visit.    Review of Systems  Constitutional: Positive for fever and chills.  HENT: Positive for congestion, postnasal drip and rhinorrhea. Negative for sinus pressure and sore throat.   Respiratory: Negative.   Cardiovascular: Negative.   Musculoskeletal:       General body aches       Objective:   Physical Exam  Constitutional: She is oriented to person, place, and time. She appears well-developed and well-nourished.  Appears acutely ill  HENT:  Pharyngeal drainage and erythema  Cardiovascular: Normal rate and regular rhythm.  Exam reveals no gallop and no friction rub.   No murmur heard. Pulmonary/Chest: Effort normal and breath sounds normal. No respiratory distress. She has no wheezes. She has no rales.  Musculoskeletal: Normal range of motion.  Lymphadenopathy:    She has cervical adenopathy.  Neurological: She is alert and oriented to person, place, and time.  Psychiatric: She has a normal mood and affect. Her behavior is normal. Judgment and thought content normal.          Assessment & Plan:

## 2012-11-20 NOTE — Assessment & Plan Note (Signed)
Patient was checked for influenza which was negative. She is currently on cephalosporin which she started last evening. I have instructed patient to take ibuprofen alternating with Tylenol for general body discomfort. If symptoms are not improved within 3-4 days she needs to contact the office, or sooner if symptoms worsen or if she develops a fever greater than 101

## 2013-01-16 ENCOUNTER — Ambulatory Visit (INDEPENDENT_AMBULATORY_CARE_PROVIDER_SITE_OTHER): Payer: BC Managed Care – PPO | Admitting: Adult Health

## 2013-01-16 ENCOUNTER — Encounter: Payer: Self-pay | Admitting: Adult Health

## 2013-01-16 VITALS — BP 102/58 | HR 91 | Temp 98.1°F | Resp 12 | Ht 61.0 in | Wt 117.5 lb

## 2013-01-16 DIAGNOSIS — J069 Acute upper respiratory infection, unspecified: Secondary | ICD-10-CM

## 2013-01-16 NOTE — Progress Notes (Signed)
Pre visit review using our clinic review tool, if applicable. No additional management support is needed unless otherwise documented below in the visit note. 

## 2013-01-16 NOTE — Progress Notes (Signed)
   Subjective:    Patient ID: [name], female    DOB: [birth year], 33 y.o.   MRN: [id number]  HPI  Pt is a pleasant 33 y/o female who presents to clinic with congestion and sinus symptoms that began yesterday. She reports calling the office yesterday to be seen but did not realize we were closed for New Years.  Denies fever, chills, shortness of breath.   Review of Systems  Constitutional: Negative for fever and chills.  HENT: Positive for congestion, postnasal drip, rhinorrhea and sinus pressure.   Respiratory: Negative.   Cardiovascular: Negative.        Objective:   Physical Exam  Constitutional: She appears well-developed and well-nourished. No distress.  HENT:  Head: Normocephalic and atraumatic.  Right Ear: External ear normal.  Left Ear: External ear normal.  Nose: Nose normal.  Mouth/Throat: Oropharynx is clear and moist.  Cardiovascular: Normal rate, regular rhythm and normal heart sounds.   Pulmonary/Chest: Breath sounds normal. No respiratory distress. She has no wheezes. She has no rales.  Lymphadenopathy:    She has no cervical adenopathy.  Neurological: She is alert.  Skin: Skin is warm and dry.  Psychiatric: She has a normal mood and affect. Her behavior is normal. Thought content normal.          Assessment & Plan:   v

## 2013-01-16 NOTE — Assessment & Plan Note (Signed)
Viral. Clear drainage. No fever. Supportive care with medication to treat symptoms. Chloraseptic for irritation of throat, tylenol, otc cough medication. Afrin for severe congestion only for 3 days. RTC if no improvement within 3-4 days.

## 2013-01-16 NOTE — Patient Instructions (Addendum)
  Tylenol for general discomfort  Saline spray to irrigate sinuses - keep them moist.  Afrin spray for severe congestion - only use 3 days.   Over the counter cough medicine if you develop a cough. Nothing with alcohol.  Chloraseptic spray or lozenges if your throat is irritated.  Call if no improvement within 3-4 days.

## 2013-10-30 ENCOUNTER — Other Ambulatory Visit: Payer: Self-pay

## 2013-11-16 ENCOUNTER — Encounter: Payer: Self-pay | Admitting: Adult Health

## 2014-09-13 ENCOUNTER — Other Ambulatory Visit: Payer: Self-pay | Admitting: Obstetrics & Gynecology

## 2014-09-14 LAB — CYTOLOGY - PAP

## 2016-08-02 DIAGNOSIS — R1084 Generalized abdominal pain: Secondary | ICD-10-CM | POA: Diagnosis not present

## 2016-08-02 DIAGNOSIS — K5900 Constipation, unspecified: Secondary | ICD-10-CM | POA: Diagnosis not present

## 2016-08-02 DIAGNOSIS — K219 Gastro-esophageal reflux disease without esophagitis: Secondary | ICD-10-CM | POA: Diagnosis not present

## 2016-10-10 DIAGNOSIS — Z01419 Encounter for gynecological examination (general) (routine) without abnormal findings: Secondary | ICD-10-CM | POA: Diagnosis not present

## 2017-02-26 DIAGNOSIS — J029 Acute pharyngitis, unspecified: Secondary | ICD-10-CM | POA: Diagnosis not present

## 2017-04-24 ENCOUNTER — Encounter: Payer: Self-pay | Admitting: Sports Medicine

## 2017-04-24 ENCOUNTER — Ambulatory Visit (INDEPENDENT_AMBULATORY_CARE_PROVIDER_SITE_OTHER): Payer: 59

## 2017-04-24 ENCOUNTER — Ambulatory Visit: Payer: 59 | Admitting: Sports Medicine

## 2017-04-24 ENCOUNTER — Other Ambulatory Visit: Payer: Self-pay | Admitting: Sports Medicine

## 2017-04-24 DIAGNOSIS — M79671 Pain in right foot: Secondary | ICD-10-CM | POA: Diagnosis not present

## 2017-04-24 DIAGNOSIS — M79672 Pain in left foot: Principal | ICD-10-CM

## 2017-04-24 DIAGNOSIS — M216X2 Other acquired deformities of left foot: Secondary | ICD-10-CM

## 2017-04-24 DIAGNOSIS — M722 Plantar fascial fibromatosis: Secondary | ICD-10-CM

## 2017-04-24 DIAGNOSIS — M216X1 Other acquired deformities of right foot: Secondary | ICD-10-CM

## 2017-04-24 NOTE — Patient Instructions (Signed)

## 2017-04-24 NOTE — Progress Notes (Signed)
Subjective: [name] is a 37 y.o. female patient presents to office with complaint of moderate arch pain general foot pain on the left and right. Patient admits to post static dyskinesia and pain in both feet for the last 2 years.  Patient states that occasionally the pain is sharp sometimes worse at the end of the day or after a period of sitting when she goes to walk and stand states that the pain sometimes can be hard to describe and feels like the pain makes her walk to the side of her foot which sometimes causes the side of the foot to hurt.  Patient has tried to treat this problem with good supportive shoes and states that the only shoes that work for her or [employer]. Denies any other pedal complaints.   Review of Systems  All other systems reviewed and are negative.    Patient Active Problem List   Diagnosis Date Noted  . Upper respiratory infection 11/20/2012  . Sinusitis 10/08/2012  . Obsessive compulsive disorder (or obsessive compulsive neurosis) 06/20/2011  . H/O reactive hypoglycemia 06/20/2011    Current Outpatient Medications on File Prior to Visit  Medication Sig Dispense Refill  . escitalopram (LEXAPRO) 10 MG tablet Take 10 mg by mouth daily.    . pantoprazole (PROTONIX) 40 MG tablet Take 40 mg by mouth daily.     No current facility-administered medications on file prior to visit.     Allergies  Allergen Reactions  . Penicillins Other (See Comments)    Childhood allergy; reaction unknown  . Sulfa Antibiotics Other (See Comments)    Childhood allergy; reaction unknown  . Wellbutrin [Bupropion Hcl] Other (See Comments)    Unknown; patient does not remember    Objective: Physical Exam General: The patient is alert and oriented x3 in no acute distress.  Dermatology: Skin is warm, dry and supple bilateral lower extremities. Nails 1-10 are normal. There is no erythema, edema, no eccymosis, no open lesions present. Integument is otherwise  unremarkable.  Vascular: Dorsalis Pedis pulse and Posterior Tibial pulse are 2/4 bilateral. Capillary fill time is immediate to all digits.  Neurological: Grossly intact to light touch with an achilles reflex of +2/5 and a  negative Tinel's sign bilateral.  Musculoskeletal: Diffuse tenderness palpation plantar surfaces of both feet with significant bowstringing of the plantar fascia bilateral and significant tight Achilles.  No pain with compression of calcaneus bilateral. No pain with tuning fork to calcaneus bilateral. No pain with calf compression bilateral. All other joints range of motion within normal limits bilateral. Strength 5/5 in all groups bilateral.    Xray, Right/Left foot:  Normal osseous mineralization. Joint spaces preserved. No fracture/dislocation/boney destruction.  Very minimal calcaneal spur present with mild thickening of plantar fascia. No other soft tissue abnormalities or radiopaque foreign bodies.   Assessment and Plan: Problem List Items Addressed This Visit    None    Visit Diagnoses    Foot pain, bilateral    -  Primary   Relevant Orders   DG Foot Complete Right   DG Foot Complete Left   Plantar fasciitis       Acquired equinus deformity of both feet         -Complete examination performed.  -Xrays reviewed -Discussed with patient in detail the condition of plantar fasciitis/arch pain with significant tight calf muscles, how this occurs and general treatment options. Explained both conservative and surgical treatments.  -No injection given at this visit since pain was  diffuse -Patient declined oral medication at this time -Recommended good supportive shoes and advised use of OTC insert. Explained to patient that if these orthoses work well, we will continue with these. If these do not improve her condition and  pain, we will consider custom molded orthoses. - Explained in detail the use of the night splint which was dispensed at today's visit.  Patient to  alternate between the right and left foot with the night splint. -Explained and dispensed to patient daily stretching exercises. -Recommend patient to ice affected area 1-2x daily. -Patient to return to office in 4 weeks for follow up or sooner if problems or questions arise.  Landis Martins, DPM

## 2017-05-30 ENCOUNTER — Encounter: Payer: Self-pay | Admitting: Sports Medicine

## 2017-05-30 ENCOUNTER — Ambulatory Visit: Payer: 59 | Admitting: Sports Medicine

## 2017-05-30 DIAGNOSIS — M216X1 Other acquired deformities of right foot: Secondary | ICD-10-CM | POA: Diagnosis not present

## 2017-05-30 DIAGNOSIS — M79671 Pain in right foot: Secondary | ICD-10-CM

## 2017-05-30 DIAGNOSIS — M722 Plantar fascial fibromatosis: Secondary | ICD-10-CM

## 2017-05-30 DIAGNOSIS — M216X2 Other acquired deformities of left foot: Secondary | ICD-10-CM

## 2017-05-30 DIAGNOSIS — M79672 Pain in left foot: Secondary | ICD-10-CM

## 2017-05-30 MED ORDER — METHYLPREDNISOLONE 4 MG PO TBPK: ORAL_TABLET | ORAL | 0 refills | Status: DC

## 2017-05-30 MED ORDER — MELOXICAM 15 MG PO TABS: 15.0000 mg | ORAL_TABLET | Freq: Every day | ORAL | 0 refills | Status: DC

## 2017-05-30 NOTE — Progress Notes (Signed)
Subjective: [name] is a 37 y.o. female patient returns to office for follow-up evaluation of bilateral foot pain.  Patient states that she has been compliant with using her night splint and taking Tylenol as needed for her overall foot pain.  Patient states that she is not sure if the night splints are helping and that her pain feels like she is walking on her bones when she is standing.  Patient states that her pain is pretty persistent at the ball or arches and the bottom of the heels.  Patient denies any increased redness swelling warmth or any other joint problems that are constant.  Patient denies a history of arthritides. Denies any other pedal complaints.    Patient Active Problem List   Diagnosis Date Noted  . Upper respiratory infection 11/20/2012  . Sinusitis 10/08/2012  . Obsessive compulsive disorder (or obsessive compulsive neurosis) 06/20/2011  . H/O reactive hypoglycemia 06/20/2011    Current Outpatient Medications on File Prior to Visit  Medication Sig Dispense Refill  . escitalopram (LEXAPRO) 10 MG tablet Take 10 mg by mouth daily.    . pantoprazole (PROTONIX) 40 MG tablet Take 40 mg by mouth daily.     No current facility-administered medications on file prior to visit.     Allergies  Allergen Reactions  . Penicillins Other (See Comments)    Childhood allergy; reaction unknown  . Sulfa Antibiotics Other (See Comments)    Childhood allergy; reaction unknown  . Wellbutrin [Bupropion Hcl] Other (See Comments)    Unknown; patient does not remember    Objective: Physical Exam General: The patient is alert and oriented x3 in no acute distress.  Dermatology: Skin is warm, dry and supple bilateral lower extremities. Nails 1-10 are normal. There is no erythema, edema, no eccymosis, no open lesions present. Integument is otherwise unremarkable.  Vascular: Dorsalis Pedis pulse and Posterior Tibial pulse are 2/4 bilateral. Capillary fill time is immediate to all  digits.  Neurological: Grossly intact to light touch with an achilles reflex of +2/5 and a  negative Tinel's sign bilateral.  Musculoskeletal: Diffuse tenderness palpation plantar surfaces of both feet with significant bowstringing of the plantar fascia bilateral and significant tight Achilles with the left being more tight compared to the right on today's exam.  No pain with compression of calcaneus bilateral. No pain with tuning fork to calcaneus bilateral. No pain with calf compression bilateral. All other joints range of motion within normal limits bilateral. Strength 5/5 in all groups bilateral.    Assessment and Plan: Problem List Items Addressed This Visit    None    Visit Diagnoses    Plantar fasciitis    -  Primary   Relevant Medications   meloxicam (MOBIC) 15 MG tablet   methylPREDNISolone (MEDROL DOSEPAK) 4 MG TBPK tablet   Acquired equinus deformity of both feet       Relevant Medications   meloxicam (MOBIC) 15 MG tablet   methylPREDNISolone (MEDROL DOSEPAK) 4 MG TBPK tablet   Foot pain, bilateral       Relevant Medications   meloxicam (MOBIC) 15 MG tablet   methylPREDNISolone (MEDROL DOSEPAK) 4 MG TBPK tablet     -Complete examination performed.  -Previous xrays reviewed -Re-Discussed with patient in detail the condition of plantar fasciitis/arch pain with significant tight calf muscles, how this occurs and general treatment options. Explained both conservative and surgical treatments.  -No injection given at this visit since pain was diffuse -Prescribed Medrol Dosepak and meloxicam to take  as instructed -Prescribed physical therapy at [facility] for additional treatment modalities for plantar fasciitis with equinus -Recommended continue with good supportive shoes and advised use of OTC insert. -Continue with night splint daily stretching exercises and icing as previously instructed. -Patient to return to office in 4 weeks for follow up or sooner if  problems or questions arise.  Advised patient that if pain is no better may consider injection therapy if can localize symptoms versus arthritic panel to check for any inflammatory conditions that may be underlying that could be contributing to her ongoing inflammatory symptoms.  Landis Martins, DPM

## 2017-06-11 DIAGNOSIS — J069 Acute upper respiratory infection, unspecified: Secondary | ICD-10-CM | POA: Diagnosis not present

## 2017-06-28 ENCOUNTER — Other Ambulatory Visit: Payer: Self-pay | Admitting: Sports Medicine

## 2017-06-28 DIAGNOSIS — M79672 Pain in left foot: Secondary | ICD-10-CM

## 2017-06-28 DIAGNOSIS — M216X1 Other acquired deformities of right foot: Secondary | ICD-10-CM

## 2017-06-28 DIAGNOSIS — M216X2 Other acquired deformities of left foot: Secondary | ICD-10-CM

## 2017-06-28 DIAGNOSIS — M79671 Pain in right foot: Secondary | ICD-10-CM

## 2017-06-28 DIAGNOSIS — M722 Plantar fascial fibromatosis: Secondary | ICD-10-CM

## 2017-07-04 ENCOUNTER — Ambulatory Visit: Payer: 59 | Admitting: Sports Medicine

## 2017-10-01 DIAGNOSIS — K219 Gastro-esophageal reflux disease without esophagitis: Secondary | ICD-10-CM | POA: Diagnosis not present

## 2017-10-01 DIAGNOSIS — K5900 Constipation, unspecified: Secondary | ICD-10-CM | POA: Diagnosis not present

## 2017-10-01 DIAGNOSIS — R1084 Generalized abdominal pain: Secondary | ICD-10-CM | POA: Diagnosis not present

## 2017-10-09 DIAGNOSIS — L299 Pruritus, unspecified: Secondary | ICD-10-CM | POA: Diagnosis not present

## 2017-10-09 DIAGNOSIS — B029 Zoster without complications: Secondary | ICD-10-CM | POA: Diagnosis not present

## 2017-10-09 DIAGNOSIS — L0889 Other specified local infections of the skin and subcutaneous tissue: Secondary | ICD-10-CM | POA: Diagnosis not present

## 2017-10-16 DIAGNOSIS — Z6825 Body mass index (BMI) 25.0-25.9, adult: Secondary | ICD-10-CM | POA: Diagnosis not present

## 2017-10-16 DIAGNOSIS — Z01419 Encounter for gynecological examination (general) (routine) without abnormal findings: Secondary | ICD-10-CM | POA: Diagnosis not present

## 2017-12-01 DIAGNOSIS — J0100 Acute maxillary sinusitis, unspecified: Secondary | ICD-10-CM | POA: Diagnosis not present

## 2019-01-16 DIAGNOSIS — M4800 Spinal stenosis, site unspecified: Secondary | ICD-10-CM

## 2019-01-16 HISTORY — DX: Spinal stenosis, site unspecified: M48.00

## 2019-02-11 ENCOUNTER — Ambulatory Visit: Payer: 59 | Attending: Internal Medicine

## 2019-02-11 DIAGNOSIS — Z20822 Contact with and (suspected) exposure to covid-19: Secondary | ICD-10-CM | POA: Insufficient documentation

## 2019-02-12 LAB — NOVEL CORONAVIRUS, NAA: SARS-CoV-2, NAA: NOT DETECTED

## 2019-09-01 ENCOUNTER — Other Ambulatory Visit: Payer: Self-pay | Admitting: Physical Medicine & Rehabilitation

## 2019-09-01 DIAGNOSIS — M5442 Lumbago with sciatica, left side: Secondary | ICD-10-CM

## 2019-09-02 ENCOUNTER — Other Ambulatory Visit: Payer: Self-pay

## 2019-09-02 ENCOUNTER — Ambulatory Visit
Admission: RE | Admit: 2019-09-02 | Discharge: 2019-09-02 | Disposition: A | Discharge status: Home / Self Care | Payer: BLUE CROSS/BLUE SHIELD | Source: Ambulatory Visit | Attending: Physical Medicine & Rehabilitation | Admitting: Physical Medicine & Rehabilitation

## 2019-09-02 DIAGNOSIS — M5442 Lumbago with sciatica, left side: Secondary | ICD-10-CM | POA: Diagnosis present

## 2020-04-21 ENCOUNTER — Encounter: Payer: BLUE CROSS/BLUE SHIELD | Admitting: Infectious Diseases

## 2020-04-25 ENCOUNTER — Other Ambulatory Visit: Payer: Self-pay

## 2020-04-25 ENCOUNTER — Encounter (HOSPITAL_COMMUNITY): Payer: Self-pay | Admitting: Obstetrics and Gynecology

## 2020-04-25 NOTE — Progress Notes (Signed)
PCP - denies Cardiologist - denies   Chest x-ray -  EKG -  Stress Test -  ECHO -  Cardiac Cath -     COVID TEST- DOS  Anesthesia review: n/a  -------------  SDW INSTRUCTIONS:  Your procedure is scheduled on 04/26/20. Please report to [facility] Main Entrance "A" at 0930 A.M., and check in at the Admitting office. Call this number if you have problems the morning of surgery: [phone]   Remember: Do not eat after midnight the night before your surgery  You may drink clear liquids until 09:30AM the morning of your surgery.   Clear liquids allowed are: Water, Non-Citrus Juices (without pulp), Carbonated Beverages, Clear Tea, Black Coffee Only, and Gatorade   Medications to take morning of surgery with a sip of water include: n/a  As of today, STOP taking any Aspirin (unless otherwise instructed by your surgeon), Aleve, Naproxen, Ibuprofen, Motrin, Advil, Goody's, BC's, all herbal medications, fish oil, and all vitamins.    The Morning of Surgery Do not wear jewelry, make-up or nail polish. Do not wear lotions, powders, or perfumes, or deodorant Do not shave 48 hours prior to surgery.   Do not bring valuables to the hospital. [facility] is not responsible for any belongings or valuables. If you are a smoker, DO NOT Smoke 24 hours prior to surgery If you wear a CPAP at night please bring your mask the morning of surgery  Remember that you must have someone to transport you home after your surgery, and remain with you for 24 hours if you are discharged the same day. Please bring cases for contacts, glasses, hearing aids, dentures or bridgework because it cannot be worn into surgery.   Patients discharged the day of surgery will not be allowed to drive home.   Please shower the NIGHT BEFORE SURGERY and the MORNING OF SURGERY with DIAL Soap. Wear comfortable clothes the morning of surgery. Oral Hygiene is also important to reduce your risk of infection.  Remember - BRUSH YOUR  TEETH THE MORNING OF SURGERY WITH YOUR REGULAR TOOTHPASTE  Patient denies shortness of breath, fever, cough and chest pain.

## 2020-04-26 ENCOUNTER — Ambulatory Visit (HOSPITAL_COMMUNITY)
Admission: RE | Admit: 2020-04-26 | Discharge: 2020-04-26 | Disposition: A | Discharge status: Home / Self Care | Payer: BC Managed Care – PPO | Attending: Obstetrics and Gynecology | Admitting: Obstetrics and Gynecology

## 2020-04-26 ENCOUNTER — Encounter (HOSPITAL_COMMUNITY)
Admission: RE | Disposition: A | Discharge status: Home / Self Care | Payer: Self-pay | Source: Home / Self Care | Attending: Obstetrics and Gynecology

## 2020-04-26 ENCOUNTER — Ambulatory Visit (HOSPITAL_COMMUNITY): Payer: BC Managed Care – PPO | Admitting: Anesthesiology

## 2020-04-26 ENCOUNTER — Other Ambulatory Visit: Payer: Self-pay

## 2020-04-26 ENCOUNTER — Encounter: Payer: BLUE CROSS/BLUE SHIELD | Admitting: Internal Medicine

## 2020-04-26 ENCOUNTER — Encounter (HOSPITAL_COMMUNITY): Payer: Self-pay | Admitting: Obstetrics and Gynecology

## 2020-04-26 DIAGNOSIS — Z8261 Family history of arthritis: Secondary | ICD-10-CM | POA: Diagnosis not present

## 2020-04-26 DIAGNOSIS — Z20822 Contact with and (suspected) exposure to covid-19: Secondary | ICD-10-CM | POA: Diagnosis not present

## 2020-04-26 DIAGNOSIS — Z8349 Family history of other endocrine, nutritional and metabolic diseases: Secondary | ICD-10-CM | POA: Diagnosis not present

## 2020-04-26 DIAGNOSIS — Z820 Family history of epilepsy and other diseases of the nervous system: Secondary | ICD-10-CM | POA: Diagnosis not present

## 2020-04-26 DIAGNOSIS — Z809 Family history of malignant neoplasm, unspecified: Secondary | ICD-10-CM | POA: Diagnosis not present

## 2020-04-26 DIAGNOSIS — Z87891 Personal history of nicotine dependence: Secondary | ICD-10-CM | POA: Insufficient documentation

## 2020-04-26 DIAGNOSIS — Z8249 Family history of ischemic heart disease and other diseases of the circulatory system: Secondary | ICD-10-CM | POA: Insufficient documentation

## 2020-04-26 DIAGNOSIS — O021 Missed abortion: Secondary | ICD-10-CM | POA: Diagnosis present

## 2020-04-26 HISTORY — PX: DILATION AND EVACUATION: SHX1459

## 2020-04-26 LAB — SARS CORONAVIRUS 2 BY RT PCR (HOSPITAL ORDER, PERFORMED IN ~~LOC~~ HOSPITAL LAB): SARS Coronavirus 2: NEGATIVE

## 2020-04-26 LAB — TYPE AND SCREEN
ABO/RH(D): O POS
Antibody Screen: NEGATIVE

## 2020-04-26 SURGERY — DILATION AND EVACUATION, UTERUS
Anesthesia: Monitor Anesthesia Care | Site: Uterus

## 2020-04-26 MED ORDER — ACETAMINOPHEN 500 MG PO TABS
1000.0000 mg | ORAL_TABLET | Freq: Once | ORAL | Status: AC
  Administered 2020-04-26: 1000 mg via ORAL
  Filled 2020-04-26: qty 2

## 2020-04-26 MED ORDER — DEXAMETHASONE SODIUM PHOSPHATE 10 MG/ML IJ SOLN
INTRAMUSCULAR | Status: DC | PRN
  Administered 2020-04-26: 10 mg via INTRAVENOUS

## 2020-04-26 MED ORDER — PROPOFOL 500 MG/50ML IV EMUL
INTRAVENOUS | Status: DC | PRN
  Administered 2020-04-26: 150 ug/kg/min via INTRAVENOUS

## 2020-04-26 MED ORDER — PROPOFOL 1000 MG/100ML IV EMUL
INTRAVENOUS | Status: AC
  Filled 2020-04-26: qty 100

## 2020-04-26 MED ORDER — 0.9 % SODIUM CHLORIDE (POUR BTL) OPTIME
TOPICAL | Status: DC | PRN
  Administered 2020-04-26: 1000 mL

## 2020-04-26 MED ORDER — DOXYCYCLINE HYCLATE 100 MG PO TABS
200.0000 mg | ORAL_TABLET | Freq: Once | ORAL | Status: AC
  Administered 2020-04-26: 200 mg via ORAL
  Filled 2020-04-26 (×2): qty 2

## 2020-04-26 MED ORDER — LIDOCAINE 2% (20 MG/ML) 5 ML SYRINGE
INTRAMUSCULAR | Status: AC
  Filled 2020-04-26: qty 5

## 2020-04-26 MED ORDER — MIDAZOLAM HCL 5 MG/5ML IJ SOLN
INTRAMUSCULAR | Status: DC | PRN
  Administered 2020-04-26 (×2): 2 mg via INTRAVENOUS

## 2020-04-26 MED ORDER — MIDAZOLAM HCL 2 MG/2ML IJ SOLN
INTRAMUSCULAR | Status: AC
  Filled 2020-04-26: qty 4

## 2020-04-26 MED ORDER — OXYCODONE HCL 5 MG PO TABS: 5.0000 mg | ORAL_TABLET | ORAL | 0 refills | Status: DC | PRN

## 2020-04-26 MED ORDER — IBUPROFEN 200 MG PO TABS
400.0000 mg | ORAL_TABLET | Freq: Four times a day (QID) | ORAL | 0 refills | Status: DC | PRN
Start: 2020-04-26 — End: 2020-07-15

## 2020-04-26 MED ORDER — ACETAMINOPHEN 325 MG PO TABS: 650.0000 mg | ORAL_TABLET | Freq: Four times a day (QID) | ORAL | 0 refills | Status: DC | PRN

## 2020-04-26 MED ORDER — KETOROLAC TROMETHAMINE 30 MG/ML IJ SOLN
INTRAMUSCULAR | Status: AC
  Filled 2020-04-26: qty 1

## 2020-04-26 MED ORDER — POVIDONE-IODINE 10 % EX SWAB
2.0000 "application " | Freq: Once | CUTANEOUS | Status: AC
  Administered 2020-04-26: 2 via TOPICAL

## 2020-04-26 MED ORDER — PROPOFOL 10 MG/ML IV BOLUS
INTRAVENOUS | Status: DC | PRN
  Administered 2020-04-26: 20 mg via INTRAVENOUS

## 2020-04-26 MED ORDER — DEXAMETHASONE SODIUM PHOSPHATE 10 MG/ML IJ SOLN
INTRAMUSCULAR | Status: AC
  Filled 2020-04-26: qty 1

## 2020-04-26 MED ORDER — KETOROLAC TROMETHAMINE 30 MG/ML IJ SOLN
INTRAMUSCULAR | Status: DC | PRN
  Administered 2020-04-26: 30 mg via INTRAVENOUS

## 2020-04-26 MED ORDER — FENTANYL CITRATE (PF) 250 MCG/5ML IJ SOLN
INTRAMUSCULAR | Status: AC
  Filled 2020-04-26: qty 5

## 2020-04-26 MED ORDER — ONDANSETRON HCL 4 MG/2ML IJ SOLN
INTRAMUSCULAR | Status: AC
  Filled 2020-04-26: qty 4

## 2020-04-26 MED ORDER — ONDANSETRON HCL 4 MG/2ML IJ SOLN
INTRAMUSCULAR | Status: DC | PRN
  Administered 2020-04-26: 4 mg via INTRAVENOUS

## 2020-04-26 MED ORDER — LACTATED RINGERS IV SOLN: INTRAVENOUS | Status: DC | PRN

## 2020-04-26 MED ORDER — ONDANSETRON HCL 4 MG/2ML IJ SOLN
INTRAMUSCULAR | Status: AC
  Filled 2020-04-26: qty 2

## 2020-04-26 MED ORDER — CHLORHEXIDINE GLUCONATE 0.12 % MT SOLN
OROMUCOSAL | Status: AC
  Administered 2020-04-26: 15 mL
  Filled 2020-04-26: qty 15

## 2020-04-26 MED ORDER — FENTANYL CITRATE (PF) 250 MCG/5ML IJ SOLN
INTRAMUSCULAR | Status: DC | PRN
  Administered 2020-04-26 (×3): 50 ug via INTRAVENOUS

## 2020-04-26 MED ORDER — LIDOCAINE 2% (20 MG/ML) 5 ML SYRINGE
INTRAMUSCULAR | Status: DC | PRN
  Administered 2020-04-26: 40 mg via INTRAVENOUS

## 2020-04-26 SURGICAL SUPPLY — 21 items
CATH ROBINSON RED A/P 16FR (CATHETERS) ×3 IMPLANT
DECANTER SPIKE VIAL GLASS SM (MISCELLANEOUS) ×3 IMPLANT
FILTER UTR ASPR ASSEMBLY (MISCELLANEOUS) ×3 IMPLANT
GAUZE 4X4 16PLY RFD (DISPOSABLE) ×2 IMPLANT
GLOVE ECLIPSE 7.0 STRL STRAW (GLOVE) ×3 IMPLANT
GLOVE SURG UNDER POLY LF SZ7 (GLOVE) ×3 IMPLANT
GOWN STRL REUS W/ TWL LRG LVL3 (GOWN DISPOSABLE) ×2 IMPLANT
GOWN STRL REUS W/TWL LRG LVL3 (GOWN DISPOSABLE) ×6
HOSE CONNECTING 18IN BERKELEY (TUBING) ×3 IMPLANT
KIT BERKELEY 1ST TRI 3/8 NO TR (MISCELLANEOUS) ×3 IMPLANT
KIT BERKELEY 1ST TRIMESTER 3/8 (MISCELLANEOUS) ×3 IMPLANT
NS IRRIG 1000ML POUR BTL (IV SOLUTION) ×3 IMPLANT
PACK VAGINAL MINOR WOMEN LF (CUSTOM PROCEDURE TRAY) ×3 IMPLANT
PAD OB MATERNITY 4.3X12.25 (PERSONAL CARE ITEMS) ×3 IMPLANT
SET BERKELEY SUCTION TUBING (SUCTIONS) ×3 IMPLANT
TOWEL GREEN STERILE FF (TOWEL DISPOSABLE) ×6 IMPLANT
UNDERPAD 30X36 HEAVY ABSORB (UNDERPADS AND DIAPERS) ×3 IMPLANT
VACURETTE 10 RIGID CVD (CANNULA) IMPLANT
VACURETTE 7MM CVD STRL WRAP (CANNULA) ×2 IMPLANT
VACURETTE 8 RIGID CVD (CANNULA) IMPLANT
VACURETTE 9 RIGID CVD (CANNULA) IMPLANT

## 2020-04-26 NOTE — H&P (Signed)
Gynecology History and Physical   [name] is a 40 y.o. female [phone] that presents for scheduled suctions dilation and evacuation for missed abortion.  She was most recently seen in the office on 04/21/20 and diagnosed with missed abortion measuring [redacted] weeks gestation. She was previously seen on 3/7 and ultrasound measured CRL of [redacted]w[redacted]d with FHR of 116 bpm.    OB History    Gravida  5   Para  2   Term  1   Preterm  1   AB  3   Living  2     SAB  1   IAB  2   Ectopic      Multiple      Live Births  2          Past Medical History:  Diagnosis Date  . Anxiety   . UTI (lower urinary tract infection)    Past Surgical History:  Procedure Laterality Date  . CESAREAN SECTION  12/30/2010   Procedure: CESAREAN SECTION;  Surgeon: Margarette Asal;  Location: [facility];  Service: Gynecology;  Laterality: N/A;  primary of baby boy  at [facility]    . CRYOTHERAPY    . TONSILLECTOMY    . TONSILLECTOMY    . WISDOM TOOTH EXTRACTION     Family History: family history includes Arthritis in her maternal grandmother, mother, and paternal grandmother; Cancer in her maternal grandmother and paternal grandfather; Hearing loss in her father; Heart disease in her maternal grandmother and paternal grandfather; Hypertension in her mother; Kidney disease in her maternal grandmother; Migraines in her mother; Thyroid disease in her mother. Social History:  reports that she quit smoking about 10 years ago. Her smoking use included cigarettes. She has a 6.00 pack-year smoking history. She has never used smokeless tobacco. She reports previous alcohol use. She reports that she does not use drugs.    Height 5\' 1"  (1.549 m), weight 59.4 kg, last menstrual period 01/29/2020. Exam Physical Exam   Gen: alert, no distress Chest: nonlabored breathing CV: no peripheral edema Ext: no evidence of DVT  Assessment/Plan: . Admit for planned procedure . PO  doxycycline for uterine evacuation prophylaxis . Risks and benefits discussed in detail, which include but not limited to bleeding, infection, uterine perforation.  Pt consents.   Carlyon Shadow 04/26/2020, 9:45 AM

## 2020-04-26 NOTE — Transfer of Care (Signed)
Immediate Anesthesia Transfer of Care Note  Patient: [name]  Procedure(s) Performed: DILATATION AND EVACUATION (N/A Uterus)  Patient Location: PACU  Anesthesia Type:MAC  Level of Consciousness: awake, alert  and oriented  Airway & Oxygen Therapy: Patient Spontanous Breathing  Post-op Assessment: Report given to RN, Post -op Vital signs reviewed and stable, Patient moving all extremities X 4 and Patient able to stick tongue midline  Post vital signs: Reviewed and stable  Last Vitals:  Vitals Value Taken Time  BP 101/68 04/26/20 1340  Temp    Pulse 87 04/26/20 1341  Resp 15 04/26/20 1341  SpO2 96 % 04/26/20 1341  Vitals shown include unvalidated device data.  Last Pain:  Vitals:   04/26/20 1003  TempSrc:   PainSc: 0-No pain         Complications: No complications documented.

## 2020-04-26 NOTE — Anesthesia Postprocedure Evaluation (Signed)
Anesthesia Post Note  Patient: [name]  Procedure(s) Performed: DILATATION AND EVACUATION (N/A Uterus)     Patient location during evaluation: PACU Anesthesia Type: MAC Level of consciousness: awake and alert Pain management: pain level controlled Vital Signs Assessment: post-procedure vital signs reviewed and stable Respiratory status: spontaneous breathing, nonlabored ventilation, respiratory function stable and patient connected to nasal cannula oxygen Cardiovascular status: stable and blood pressure returned to baseline Postop Assessment: no apparent nausea or vomiting Anesthetic complications: no   No complications documented.  Last Vitals:  Vitals:   04/26/20 1355 04/26/20 1410  BP: 101/69 103/67  Pulse: 65 79  Resp: 16 18  Temp:    SpO2: 97% 98%    Last Pain:  Vitals:   04/26/20 1410  TempSrc:   PainSc: 0-No pain                 Catalina Gravel

## 2020-04-26 NOTE — Op Note (Signed)
PREOPERATIVE DIAGNOSES: 1. Missed Abortion  POSTOPERATIVE DIAGNOSES: Same  PROCEDURE PERFORMED: Dilation, suction, sharp curretage  SURGEON: Dr. Alpha Gula MD  ANESTHESIA: Paracervical block and IV sedation  ESTIMATED BLOOD LOSS: 10 cc.  COMPLICATIONS: None  TUBES: None.  DRAINS: None  PATHOLOGY: Endometrial curettings, products of conception  FINDINGS: On exam, under anesthesia, normal appearing vulva and vagina, 6 week sized uterus  Operative findings demonstrated POCs   Procedure: The patient was taken to the operating room where she was properly prepped and draped in sterile manner under general anesthesia. After bimanual examination, the cervix was exposed with a speculum and the anterior lip of the cervix grasped with a tenaculum. Paracervical block performed. The endocervical canal was then progressively dilated to 9mm. Suction catheter was introduced into the uterus and to the uterine fundus. The uterus was evacuated and good tissue return was noted. A sharp curettage was then performed until gritty texture noted. All instruments were removed from vagina. The sponge and lap counts were correct times 2 at this time. The patient's procedure was terminated. We then awakened her. She was sent to the Recovery Room in good condition.    Alpha Gula MD

## 2020-04-26 NOTE — Anesthesia Preprocedure Evaluation (Addendum)
Anesthesia Evaluation  Patient identified by MRN, date of birth, ID band Patient awake    Reviewed: Allergy & Precautions, NPO status , Patient's Chart, lab work & pertinent test results  Airway Mallampati: II  TM Distance: >3 FB Neck ROM: Full    Dental  (+) Teeth Intact, Dental Advisory Given   Pulmonary former smoker,    Pulmonary exam normal breath sounds clear to auscultation       Cardiovascular negative cardio ROS Normal cardiovascular exam Rhythm:Regular Rate:Normal     Neuro/Psych PSYCHIATRIC DISORDERS Anxiety negative neurological ROS     GI/Hepatic negative GI ROS, Neg liver ROS,   Endo/Other  negative endocrine ROS  Renal/GU negative Renal ROS     Musculoskeletal negative musculoskeletal ROS (+)   Abdominal   Peds  Hematology negative hematology ROS (+)   Anesthesia Other Findings Day of surgery medications reviewed with the patient.  Reproductive/Obstetrics Missed abortion--6 wks                             Anesthesia Physical Anesthesia Plan  ASA: II  Anesthesia Plan: MAC   Post-op Pain Management:    Induction: Intravenous  PONV Risk Score and Plan: 2 and Midazolam, Propofol infusion and Ondansetron  Airway Management Planned: Nasal Cannula and Natural Airway  Additional Equipment:   Intra-op Plan:   Post-operative Plan:   Informed Consent: I have reviewed the patients History and Physical, chart, labs and discussed the procedure including the risks, benefits and alternatives for the proposed anesthesia with the patient or authorized representative who has indicated his/her understanding and acceptance.     Dental advisory given  Plan Discussed with: CRNA and Anesthesiologist  Anesthesia Plan Comments:         Anesthesia Quick Evaluation

## 2020-04-27 ENCOUNTER — Encounter (HOSPITAL_COMMUNITY): Payer: Self-pay | Admitting: Obstetrics and Gynecology

## 2020-04-27 LAB — SURGICAL PATHOLOGY

## 2020-05-11 ENCOUNTER — Other Ambulatory Visit: Payer: Self-pay

## 2020-05-11 ENCOUNTER — Ambulatory Visit (INDEPENDENT_AMBULATORY_CARE_PROVIDER_SITE_OTHER): Payer: BC Managed Care – PPO | Admitting: Internal Medicine

## 2020-05-11 ENCOUNTER — Encounter: Payer: Self-pay | Admitting: Internal Medicine

## 2020-05-11 DIAGNOSIS — K759 Inflammatory liver disease, unspecified: Secondary | ICD-10-CM

## 2020-05-11 DIAGNOSIS — R768 Other specified abnormal immunological findings in serum: Secondary | ICD-10-CM | POA: Insufficient documentation

## 2020-05-11 HISTORY — DX: Inflammatory liver disease, unspecified: K75.9

## 2020-05-11 NOTE — Progress Notes (Signed)
[facility] for Infectious Disease      Reason for Consult: positive hepatitis C Ab    Referring Physician: Dr. Lynnette Caffey    Patient ID: [name], female    DOB: 11-25-1980, 40 y.o.   MRN: [id number]  HPI:   She is here for evaluation of a positive hepatitis C Ab. This was checked routinely during prenatal care.  Ab test was done on 04/06/20 and was positive.  A follow up hepatitis C RNA was check 04/13/20 and was negative for active infection.  She does not recall a specific previous test but did have a baby in 2014 and likely checked then.  She denies any significant risk factors including no injection drug use, no snorting, no tattoos.  She has had one sexual partner during that time and now with the childs father who has not yet been tested.     Past Medical History:  Diagnosis Date  . Anxiety   . UTI (lower urinary tract infection)     Prior to Admission medications   Medication Sig Start Date End Date Taking? Authorizing Provider  acetaminophen (TYLENOL) 325 MG tablet Take 2 tablets (650 mg total) by mouth every 6 (six) hours as needed. 04/26/20   Carlyon Shadow, MD  doxylamine, Sleep, (UNISOM) 25 MG tablet Take 25 mg by mouth daily as needed (nausea). Take with b6    [provider]  ibuprofen (MOTRIN IB) 200 MG tablet Take 2 tablets (400 mg total) by mouth every 6 (six) hours as needed. 04/26/20   Carlyon Shadow, MD  oxyCODONE (ROXICODONE) 5 MG immediate release tablet Take 1 tablet (5 mg total) by mouth every 4 (four) hours as needed for severe pain. 04/26/20   Carlyon Shadow, MD  Prenatal Vit-Fe Fumarate-FA (PRENATAL PO) Take 1 tablet by mouth daily.    [provider]  Pyridoxine HCl (B-6 PO) Take 1 tablet by mouth daily as needed (nausea). Take with unisom    [provider]    Allergies  Allergen Reactions  . Penicillins Other (See Comments)    Childhood allergy; reaction unknown  . Sulfa Antibiotics Other (See Comments)     Childhood allergy; reaction unknown  . Wellbutrin [Bupropion Hcl] Itching and Nausea And Vomiting    Social History   Tobacco Use  . Smoking status: Former Smoker    Packs/day: 0.50    Years: 12.00    Pack years: 6.00    Types: Cigarettes    Quit date: 04/16/2010    Years since quitting: 10.0  . Smokeless tobacco: Never Used  Vaping Use  . Vaping Use: Never used  Substance Use Topics  . Alcohol use: Not Currently  . Drug use: No    Family History  Problem Relation Age of Onset  . Arthritis Mother   . Hypertension Mother   . Migraines Mother   . Thyroid disease Mother   . Hearing loss Father   . Arthritis Maternal Grandmother   . Cancer Maternal Grandmother        skin  . Kidney disease Maternal Grandmother   . Heart disease Maternal Grandmother   . Arthritis Paternal Grandmother   . Cancer Paternal Grandfather        throat  . Heart disease Paternal Grandfather     Review of Systems  Constitutional: negative for fatigue, malaise and anorexia Gastrointestinal: negative for nausea and diarrhea Integument/breast: negative for rash All other systems reviewed and are negative  Constitutional: in no apparent distress There were no vitals filed for this visit. EYES: anicteric ENMT: no thrush Respiratory: normal respiratory effort Skin: negatives: no rash Neuro: non-focal  Labs: Lab Results  Component Value Date   WBC 8.7 06/24/2012   HGB 9.2 (L) 06/24/2012   HCT 28.2 (L) 06/24/2012   MCV 94.3 06/24/2012   PLT 168 06/24/2012    Lab Results  Component Value Date   CREATININE 0.57 06/18/2012   BUN 5 (L) 06/18/2012   NA 135 06/18/2012   K 3.1 (L) 06/18/2012   CL 105 06/18/2012   CO2 21 06/18/2012    Lab Results  Component Value Date   ALT 10 06/18/2012   AST 12 06/18/2012   ALKPHOS 80 06/18/2012   BILITOT 0.2 (L) 06/18/2012     Assessment: hepatitis C antibody positive, RNA negative confirming negative for active chronic hepatitis C.  I am not clear  of the risk factors but sexual transmission possible and her current partner will get tested.  I did advise condom use in the meantime until tested.  Avoid sharing razors, toothbrushes.   Plan: 1) reassurance provided Return as needed

## 2020-07-15 ENCOUNTER — Encounter (HOSPITAL_BASED_OUTPATIENT_CLINIC_OR_DEPARTMENT_OTHER): Payer: Self-pay | Admitting: Obstetrics & Gynecology

## 2020-07-15 ENCOUNTER — Other Ambulatory Visit: Payer: Self-pay

## 2020-07-15 NOTE — H&P (Signed)
[name] is an 40 y.o. female s/p D&C for missed ab at 6 weeks on 04/26/20.  Patient reported no return of menses and u/s was done in the office on 6/29 with thickened inhomogeneous endometrium with increased blood flow consistent with retained products of conception.  Patient was offered trial of methergine or cytotec and declined.  She has no abdominal pain, fever or chills.  Pertinent Gynecological History: Menses:  oligomenorrhea Bleeding: n/a Contraception: abstinence DES exposure: unknown Blood transfusions: none Sexually transmitted diseases: no past history Previous GYN Procedures: DNC and C/s   Last mammogram:  n/a  Date: n/a Last pap: abnormal: ASCUS  Date: 04/21/20 OB History: G6, P1142   Menstrual History: Menarche age: n/a Patient's last menstrual period was 02/17/2020 (approximate).    Past Medical History:  Diagnosis Date   Anxiety    Depression    GERD (gastroesophageal reflux disease)    Hepatitis 05/11/2020   positve hepatitis c antibody   Spinal stenosis 2021   thoracic/sacral  level did back injections x 2    Past Surgical History:  Procedure Laterality Date   CESAREAN SECTION  12/30/2010   Procedure: CESAREAN SECTION;  Surgeon: Margarette Asal;  Location: [facility];  Service: Gynecology;  Laterality: N/A;  primary of baby boy  at [facility] [facility] N/A 04/26/2020   Procedure: DILATATION AND EVACUATION;  Surgeon: Carlyon Shadow, MD;  Location: [facility];  Service: Gynecology;  Laterality: N/A;   TONSILLECTOMY     as child   WISDOM TOOTH EXTRACTION     age 57    Family History  Problem Relation Age of Onset   Arthritis Mother    Hypertension Mother    Migraines Mother    Thyroid disease Mother    Hearing loss Father    Arthritis Maternal Grandmother    Cancer Maternal Grandmother        skin   Kidney disease Maternal Grandmother    Heart disease Maternal  Grandmother    Arthritis Paternal Grandmother    Cancer Paternal Grandfather        throat   Heart disease Paternal Grandfather     Social History:  reports that she quit smoking about 10 years ago. Her smoking use included cigarettes. She has a 6.00 pack-year smoking history. She has never used smokeless tobacco. She reports current alcohol use. She reports that she does not use drugs.  Allergies:  Allergies  Allergen Reactions   Penicillins Other (See Comments)    Childhood allergy; reaction unknown   Sulfa Antibiotics Other (See Comments)    Childhood allergy; reaction unknown   Wellbutrin [Bupropion Hcl] Itching and Nausea And Vomiting    No medications prior to admission.    Review of Systems  Height 5\' 1"  (1.549 m), weight 59.9 kg, last menstrual period 02/17/2020. Physical Exam Constitutional:      Appearance: Normal appearance.  HENT:     Head: Normocephalic and atraumatic.  Pulmonary:     Effort: Pulmonary effort is normal.  Abdominal:     Palpations: Abdomen is soft.  Musculoskeletal:        General: Normal range of motion.     Cervical back: Normal range of motion.  Skin:    General: Skin is warm.  Neurological:     Mental Status: She is alert and oriented to person, place, and time.  Psychiatric:  Mood and Affect: Mood normal.        Behavior: Behavior normal.    No results found for this or any previous visit (from the past 24 hour(s)).  No results found.  Assessment/Plan: 40yo with retained products of conception -Suction D&C under ultrasound guidance -Patient has been counseled re: risk of bleeding, infection, scarring, and damage to surrounding structures.  All questions were answered and patient wishes to proceed.  Linda Hedges 07/15/2020, 6:39 PM

## 2020-07-15 NOTE — Progress Notes (Signed)
Spoke w/ via phone for pre-op interview---pt Lab needs dos----   urine poct per anesthesia surgery orders pending            Lab results------none COVID test -----patient states asymptomatic no test needed Arrive at -------1115 am 07-21-2020 NPO after MN NO Solid Food.  Clear liquids from MN until---1015 am then npo Med rec completed Medications to take morning of surgery -----none Diabetic medication -----n/a Patient instructed no nail polish to be worn day of surgery Patient instructed to bring photo id and insurance card day of surgery Patient aware to have Driver (ride ) / caregiver  spouse [name]   for 24 hours after surgery  Patient Special Instructions -----none Pre-Op special Istructions -----none Patient verbalized understanding of instructions that were given at this phone interview. Patient denies shortness of breath, chest pain, fever, cough at this phone interview.

## 2020-07-21 ENCOUNTER — Encounter (HOSPITAL_BASED_OUTPATIENT_CLINIC_OR_DEPARTMENT_OTHER)
Admission: RE | Disposition: A | Discharge status: Home / Self Care | Payer: Self-pay | Source: Home / Self Care | Attending: Obstetrics & Gynecology

## 2020-07-21 ENCOUNTER — Ambulatory Visit (HOSPITAL_BASED_OUTPATIENT_CLINIC_OR_DEPARTMENT_OTHER)
Admission: RE | Admit: 2020-07-21 | Discharge: 2020-07-21 | Disposition: A | Discharge status: Home / Self Care | Payer: BC Managed Care – PPO | Attending: Obstetrics & Gynecology | Admitting: Obstetrics & Gynecology

## 2020-07-21 ENCOUNTER — Ambulatory Visit (HOSPITAL_COMMUNITY)
Admission: RE | Admit: 2020-07-21 | Discharge: 2020-07-21 | Disposition: A | Discharge status: Home / Self Care | Payer: BC Managed Care – PPO | Source: Ambulatory Visit | Attending: Obstetrics & Gynecology | Admitting: Obstetrics & Gynecology

## 2020-07-21 ENCOUNTER — Other Ambulatory Visit: Payer: Self-pay

## 2020-07-21 ENCOUNTER — Ambulatory Visit (HOSPITAL_BASED_OUTPATIENT_CLINIC_OR_DEPARTMENT_OTHER): Payer: BC Managed Care – PPO | Admitting: Anesthesiology

## 2020-07-21 ENCOUNTER — Encounter (HOSPITAL_BASED_OUTPATIENT_CLINIC_OR_DEPARTMENT_OTHER): Payer: Self-pay | Admitting: Obstetrics & Gynecology

## 2020-07-21 DIAGNOSIS — Z882 Allergy status to sulfonamides status: Secondary | ICD-10-CM | POA: Insufficient documentation

## 2020-07-21 DIAGNOSIS — Z800 Family history of malignant neoplasm of digestive organs: Secondary | ICD-10-CM | POA: Diagnosis not present

## 2020-07-21 DIAGNOSIS — Z808 Family history of malignant neoplasm of other organs or systems: Secondary | ICD-10-CM | POA: Diagnosis not present

## 2020-07-21 DIAGNOSIS — Z8261 Family history of arthritis: Secondary | ICD-10-CM | POA: Insufficient documentation

## 2020-07-21 DIAGNOSIS — Z841 Family history of disorders of kidney and ureter: Secondary | ICD-10-CM | POA: Diagnosis not present

## 2020-07-21 DIAGNOSIS — O021 Missed abortion: Secondary | ICD-10-CM | POA: Diagnosis present

## 2020-07-21 DIAGNOSIS — O034 Incomplete spontaneous abortion without complication: Secondary | ICD-10-CM

## 2020-07-21 DIAGNOSIS — Z87891 Personal history of nicotine dependence: Secondary | ICD-10-CM | POA: Insufficient documentation

## 2020-07-21 DIAGNOSIS — Z8249 Family history of ischemic heart disease and other diseases of the circulatory system: Secondary | ICD-10-CM | POA: Diagnosis not present

## 2020-07-21 DIAGNOSIS — Z820 Family history of epilepsy and other diseases of the nervous system: Secondary | ICD-10-CM | POA: Insufficient documentation

## 2020-07-21 DIAGNOSIS — Z880 Allergy status to penicillin: Secondary | ICD-10-CM | POA: Insufficient documentation

## 2020-07-21 HISTORY — PX: OPERATIVE ULTRASOUND: SHX5996

## 2020-07-21 HISTORY — DX: Depression, unspecified: F32.A

## 2020-07-21 HISTORY — DX: Gastro-esophageal reflux disease without esophagitis: K21.9

## 2020-07-21 HISTORY — PX: DILATION AND EVACUATION: SHX1459

## 2020-07-21 LAB — CBC
HCT: 44.8 % (ref 36.0–46.0)
Hemoglobin: 15.2 g/dL — ABNORMAL HIGH (ref 12.0–15.0)
MCH: 31.3 pg (ref 26.0–34.0)
MCHC: 33.9 g/dL (ref 30.0–36.0)
MCV: 92.2 fL (ref 80.0–100.0)
Platelets: 199 10*3/uL (ref 150–400)
RBC: 4.86 MIL/uL (ref 3.87–5.11)
RDW: 12.4 % (ref 11.5–15.5)
WBC: 6.5 10*3/uL (ref 4.0–10.5)
nRBC: 0 % (ref 0.0–0.2)

## 2020-07-21 LAB — TYPE AND SCREEN
ABO/RH(D): O POS
Antibody Screen: NEGATIVE

## 2020-07-21 SURGERY — DILATION AND EVACUATION, UTERUS
Anesthesia: General | Site: Vagina

## 2020-07-21 MED ORDER — DEXAMETHASONE SODIUM PHOSPHATE 10 MG/ML IJ SOLN
INTRAMUSCULAR | Status: AC
  Filled 2020-07-21: qty 1

## 2020-07-21 MED ORDER — KETOROLAC TROMETHAMINE 30 MG/ML IJ SOLN
INTRAMUSCULAR | Status: DC | PRN
  Administered 2020-07-21: 30 mg via INTRAVENOUS

## 2020-07-21 MED ORDER — POVIDONE-IODINE 10 % EX SWAB: 2.0000 "application " | Freq: Once | CUTANEOUS | Status: DC

## 2020-07-21 MED ORDER — OXYCODONE HCL 5 MG PO TABS
ORAL_TABLET | ORAL | Status: AC
  Filled 2020-07-21: qty 1

## 2020-07-21 MED ORDER — DEXAMETHASONE SODIUM PHOSPHATE 10 MG/ML IJ SOLN
INTRAMUSCULAR | Status: DC | PRN
  Administered 2020-07-21: 10 mg via INTRAVENOUS

## 2020-07-21 MED ORDER — OXYCODONE HCL 5 MG PO TABS
5.0000 mg | ORAL_TABLET | Freq: Once | ORAL | Status: AC | PRN
Start: 2020-07-21 — End: 2020-07-21
  Administered 2020-07-21: 5 mg via ORAL

## 2020-07-21 MED ORDER — IBUPROFEN 800 MG PO TABS: 800.0000 mg | ORAL_TABLET | Freq: Four times a day (QID) | ORAL | 0 refills | Status: DC | PRN

## 2020-07-21 MED ORDER — LACTATED RINGERS IV SOLN: INTRAVENOUS | Status: DC

## 2020-07-21 MED ORDER — SODIUM CHLORIDE 0.9 % IV SOLN
100.0000 mg | Freq: Once | INTRAVENOUS | Status: AC
  Administered 2020-07-21: 100 mg via INTRAVENOUS
  Filled 2020-07-21: qty 100

## 2020-07-21 MED ORDER — DOXYCYCLINE HYCLATE 100 MG PO TABS
200.0000 mg | ORAL_TABLET | Freq: Once | ORAL | Status: AC
  Administered 2020-07-21: 200 mg via ORAL

## 2020-07-21 MED ORDER — OXYCODONE HCL 5 MG/5ML PO SOLN: 5.0000 mg | Freq: Once | ORAL | Status: AC | PRN

## 2020-07-21 MED ORDER — FENTANYL CITRATE (PF) 100 MCG/2ML IJ SOLN
INTRAMUSCULAR | Status: AC
  Filled 2020-07-21: qty 2

## 2020-07-21 MED ORDER — FENTANYL CITRATE (PF) 100 MCG/2ML IJ SOLN
INTRAMUSCULAR | Status: DC | PRN
  Administered 2020-07-21: 100 ug via INTRAVENOUS

## 2020-07-21 MED ORDER — PROPOFOL 10 MG/ML IV BOLUS
INTRAVENOUS | Status: DC | PRN
  Administered 2020-07-21: 160 mg via INTRAVENOUS

## 2020-07-21 MED ORDER — LIDOCAINE 2% (20 MG/ML) 5 ML SYRINGE
INTRAMUSCULAR | Status: DC | PRN
  Administered 2020-07-21: 80 mg via INTRAVENOUS

## 2020-07-21 MED ORDER — MIDAZOLAM HCL 2 MG/2ML IJ SOLN
INTRAMUSCULAR | Status: AC
  Filled 2020-07-21: qty 2

## 2020-07-21 MED ORDER — FENTANYL CITRATE (PF) 100 MCG/2ML IJ SOLN
25.0000 ug | INTRAMUSCULAR | Status: DC | PRN
  Administered 2020-07-21 (×2): 25 ug via INTRAVENOUS

## 2020-07-21 MED ORDER — ONDANSETRON HCL 4 MG/2ML IJ SOLN
INTRAMUSCULAR | Status: DC | PRN
  Administered 2020-07-21: 4 mg via INTRAVENOUS

## 2020-07-21 MED ORDER — LIDOCAINE HCL (PF) 2 % IJ SOLN
INTRAMUSCULAR | Status: AC
  Filled 2020-07-21: qty 5

## 2020-07-21 MED ORDER — ONDANSETRON HCL 4 MG/2ML IJ SOLN
INTRAMUSCULAR | Status: AC
  Filled 2020-07-21: qty 2

## 2020-07-21 MED ORDER — OXYCODONE-ACETAMINOPHEN 5-325 MG PO TABS: 1.0000 | ORAL_TABLET | ORAL | 0 refills | Status: DC | PRN

## 2020-07-21 MED ORDER — 0.9 % SODIUM CHLORIDE (POUR BTL) OPTIME
TOPICAL | Status: DC | PRN
  Administered 2020-07-21: 500 mL

## 2020-07-21 MED ORDER — PROPOFOL 10 MG/ML IV BOLUS
INTRAVENOUS | Status: AC
  Filled 2020-07-21: qty 20

## 2020-07-21 MED ORDER — MIDAZOLAM HCL 5 MG/5ML IJ SOLN
INTRAMUSCULAR | Status: DC | PRN
  Administered 2020-07-21: 2 mg via INTRAVENOUS

## 2020-07-21 MED ORDER — KETOROLAC TROMETHAMINE 30 MG/ML IJ SOLN
INTRAMUSCULAR | Status: AC
  Filled 2020-07-21: qty 1

## 2020-07-21 MED ORDER — DOXYCYCLINE HYCLATE 100 MG PO TABS
ORAL_TABLET | ORAL | Status: AC
  Filled 2020-07-21: qty 2

## 2020-07-21 SURGICAL SUPPLY — 16 items
CATH ROBINSON RED A/P 16FR (CATHETERS) ×3 IMPLANT
FILTER UTR ASPR ASSEMBLY (MISCELLANEOUS) ×3 IMPLANT
GLOVE SURG POLYISO LF SZ5.5 (GLOVE) ×6 IMPLANT
GLOVE SURG UNDER POLY LF SZ6 (GLOVE) ×3 IMPLANT
HOSE CONNECTING 18IN BERKELEY (TUBING) ×3 IMPLANT
KIT BERKELEY 1ST TRIMESTER 3/8 (MISCELLANEOUS) ×6 IMPLANT
KIT TURNOVER CYSTO (KITS) ×3 IMPLANT
NS IRRIG 500ML POUR BTL (IV SOLUTION) ×1 IMPLANT
PACK VAGINAL MINOR WOMEN LF (CUSTOM PROCEDURE TRAY) ×3 IMPLANT
SET BERKELEY SUCTION TUBING (SUCTIONS) ×3 IMPLANT
TOWEL OR 17X26 10 PK STRL BLUE (TOWEL DISPOSABLE) ×5 IMPLANT
VACURETTE 6 ASPIR F TIP BERK (CANNULA) IMPLANT
VACURETTE 7MM CVD STRL WRAP (CANNULA) ×1 IMPLANT
VACURETTE 8 RIGID CVD (CANNULA) IMPLANT
VACURETTE 9 RIGID CVD (CANNULA) IMPLANT
WATER STERILE IRR 500ML POUR (IV SOLUTION) ×2 IMPLANT

## 2020-07-21 NOTE — Anesthesia Procedure Notes (Signed)
Procedure Name: LMA Insertion Date/Time: 07/21/2020 1:25 PM Performed by: Marli Diego D, CRNA Pre-anesthesia Checklist: Patient identified, Emergency Drugs available, Suction available and Patient being monitored Patient Re-evaluated:Patient Re-evaluated prior to induction Oxygen Delivery Method: Circle system utilized Preoxygenation: Pre-oxygenation with 100% oxygen Induction Type: IV induction Ventilation: Mask ventilation without difficulty LMA: LMA inserted LMA Size: 3.0 Tube type: Oral Number of attempts: 1 Placement Confirmation: positive ETCO2 and breath sounds checked- equal and bilateral Tube secured with: Tape Dental Injury: Teeth and Oropharynx as per pre-operative assessment

## 2020-07-21 NOTE — Anesthesia Postprocedure Evaluation (Signed)
Anesthesia Post Note  Patient: [name]  Procedure(s) Performed: DILATATION AND EVACUATION (Vagina ) OPERATIVE ULTRASOUND (Abdomen)     Patient location during evaluation: PACU Anesthesia Type: General Level of consciousness: awake Pain management: pain level controlled Vital Signs Assessment: post-procedure vital signs reviewed and stable Respiratory status: spontaneous breathing Cardiovascular status: stable Postop Assessment: no apparent nausea or vomiting Anesthetic complications: no   No notable events documented.  Last Vitals:  Vitals:   07/21/20 1430 07/21/20 1445  BP: 120/67 123/74  Pulse: 62 72  Resp: (!) 23 19  Temp:  36.7 C  SpO2: 94% 97%    Last Pain:  Vitals:   07/21/20 1400  TempSrc:   PainSc: 5                  Thalya Fouche

## 2020-07-21 NOTE — Progress Notes (Signed)
No change to H&P.  Octavion Mollenkopf, DO 

## 2020-07-21 NOTE — Transfer of Care (Signed)
Immediate Anesthesia Transfer of Care Note  Patient: [name]  Procedure(s) Performed: DILATATION AND EVACUATION (Vagina ) OPERATIVE ULTRASOUND (Abdomen)  Patient Location: PACU  Anesthesia Type:General  Level of Consciousness: awake, alert  and oriented  Airway & Oxygen Therapy: Patient Spontanous Breathing and Patient connected to nasal cannula oxygen  Post-op Assessment: Report given to RN and Post -op Vital signs reviewed and stable  Post vital signs: Reviewed and stable  Last Vitals:  Vitals Value Taken Time  BP 125/85 07/21/20 1354  Temp    Pulse 85 07/21/20 1357  Resp 19 07/21/20 1357  SpO2 100 % 07/21/20 1357  Vitals shown include unvalidated device data.  Last Pain:  Vitals:   07/21/20 1128  TempSrc: Oral  PainSc: 3       Patients Stated Pain Goal: 6 (35/70/17 7939)  Complications: No notable events documented.

## 2020-07-21 NOTE — Anesthesia Preprocedure Evaluation (Signed)
Anesthesia Evaluation  Patient identified by MRN, date of birth, ID band Patient awake    Reviewed: Allergy & Precautions, NPO status , Patient's Chart, lab work & pertinent test results  Airway Mallampati: II  TM Distance: >3 FB     Dental   Pulmonary former smoker,    breath sounds clear to auscultation       Cardiovascular negative cardio ROS   Rhythm:Regular Rate:Normal     Neuro/Psych    GI/Hepatic GERD  ,(+) Hepatitis -  Endo/Other  negative endocrine ROS  Renal/GU negative Renal ROS     Musculoskeletal   Abdominal   Peds  Hematology   Anesthesia Other Findings   Reproductive/Obstetrics                             Anesthesia Physical Anesthesia Plan  ASA: 2  Anesthesia Plan: General   Post-op Pain Management:    Induction: Intravenous  PONV Risk Score and Plan: 3 and Ondansetron, Dexamethasone and Midazolam  Airway Management Planned: LMA  Additional Equipment:   Intra-op Plan:   Post-operative Plan: Extubation in OR  Informed Consent: I have reviewed the patients History and Physical, chart, labs and discussed the procedure including the risks, benefits and alternatives for the proposed anesthesia with the patient or authorized representative who has indicated his/her understanding and acceptance.     Dental advisory given  Plan Discussed with: CRNA and Anesthesiologist  Anesthesia Plan Comments:         Anesthesia Quick Evaluation

## 2020-07-21 NOTE — Discharge Instructions (Addendum)
Call MD for T>100.4, heavy vaginal bleeding, severe abdominal pain, intractable nausea and/or vomiting, or respiratory distress.  Call office to schedule postop visit in 2 weeks.  Pelvic rest x 4 weeks.  No driving while taking narcotics.  DISCHARGE INSTRUCTIONS: D&C / D&E The following instructions have been prepared to help you care for yourself upon your return home.   Personal hygiene:  Use sanitary pads for vaginal drainage, not tampons.  Shower the day after your procedure.  NO tub baths, pools or Jacuzzis for 2-3 weeks.  Wipe front to back after using the bathroom.  Activity and limitations:  Do NOT drive or operate any equipment for 24 hours. The effects of anesthesia are still present and drowsiness may result.  Do NOT rest in bed all day.  Walking is encouraged.  Walk up and down stairs slowly.  You may resume your normal activity in one to two days or as indicated by your physician.  Sexual activity: NO intercourse for at least 2 weeks after the procedure, or as indicated by your physician.  Diet: Eat a light meal as desired this evening. You may resume your usual diet tomorrow.  Return to work: You may resume your work activities in one to two days or as indicated by your doctor.  What to expect after your surgery: Expect to have vaginal bleeding/discharge for 2-3 days and spotting for up to 10 days. It is not unusual to have soreness for up to 1-2 weeks. You may have a slight burning sensation when you urinate for the first day. Mild cramps may continue for a couple of days. You may have a regular period in 2-6 weeks.  Call your doctor for any of the following:  Excessive vaginal bleeding, saturating and changing one pad every hour.  Inability to urinate 6 hours after discharge from hospital.  Pain not relieved by pain medication.  Fever of 100.4 F or greater.  Unusual vaginal discharge or odor.   Call for an appointment:      Post Anesthesia Home Care  Instructions  Activity: Get plenty of rest for the remainder of the day. A responsible individual must stay with you for 24 hours following the procedure.  For the next 24 hours, DO NOT: -Drive a car -Paediatric nurse -Drink alcoholic beverages -Take any medication unless instructed by your physician -Make any legal decisions or sign important papers.  Meals: Start with liquid foods such as gelatin or soup. Progress to regular foods as tolerated. Avoid greasy, spicy, heavy foods. If nausea and/or vomiting occur, drink only clear liquids until the nausea and/or vomiting subsides. Call your physician if vomiting continues.  Special Instructions/Symptoms: Your throat may feel dry or sore from the anesthesia or the breathing tube placed in your throat during surgery. If this causes discomfort, gargle with warm salt water. The discomfort should disappear within 24 hours.

## 2020-07-21 NOTE — Op Note (Signed)
PREOPERATIVE DIAGNOSIS:  40yo with retained products of conception    POSTOPERATIVE DIAGNOSIS: The same   PROCEDURE: Suction Dilation and Curettage under ultrasound guidance   SURGEON:  Dr. Linda Hedges   INDICATIONS: 40 y.o. here for scheduled surgery for Suction D&C for removal of retained products of conception s/p D&C for MAB at 6 weeks.  Risks of surgery were discussed with the patient including but not limited to: bleeding which may require transfusion; infection which may require antibiotics; injury to uterus or surrounding organs; intrauterine scarring which may impair future fertility; need for additional procedures including laparotomy or laparoscopy; and other postoperative/anesthesia complications. Written informed consent was obtained.    FINDINGS:  An 8 week sized uterus    ANESTHESIA: General   ESTIMATED BLOOD LOSS:  50 cc   SPECIMENS: POC sent to pathology    COMPLICATIONS:  None immediate.   PROCEDURE DETAILS:  The patient was taken to the operating room where general anesthesia was administered.  After an adequate timeout was performed, she was placed in the dorsal lithotomy position and examined; then prepped and draped in the sterile manner.   Her bladder was catheterized for an unmeasured amount of clear, yellow urine. A speculum was then placed in the patient's vagina and a tenaculum was applied to the anterior lip of the cervix.  The uterus was dilated manually with metal dilators to accommodate the 7 mm suction curette.  Once the cervix was dilated, the suction curette was advanced without difficulty.  Suction curettage was performed x 3 passes under ultrasound guidance. Sharp curettage was performed and a gritty texture was appreciated globally and ultrasound confirmed no remaining POC.  A final suction curettage was performed.  The tenaculum was removed from the anterior lip of the cervix. Hemostasis was observed. The vaginal speculum was removed after noting good  hemostasis.  The patient tolerated the procedure well and was taken to the recovery area awake and in stable condition.

## 2020-07-22 LAB — SURGICAL PATHOLOGY

## 2020-07-25 ENCOUNTER — Encounter (HOSPITAL_BASED_OUTPATIENT_CLINIC_OR_DEPARTMENT_OTHER): Payer: Self-pay | Admitting: Obstetrics & Gynecology

## 2020-11-23 ENCOUNTER — Other Ambulatory Visit: Payer: Self-pay

## 2020-11-23 ENCOUNTER — Ambulatory Visit (INDEPENDENT_AMBULATORY_CARE_PROVIDER_SITE_OTHER): Payer: BC Managed Care – PPO | Admitting: Dermatology

## 2020-11-23 DIAGNOSIS — L578 Other skin changes due to chronic exposure to nonionizing radiation: Secondary | ICD-10-CM | POA: Diagnosis not present

## 2020-11-23 DIAGNOSIS — Z1283 Encounter for screening for malignant neoplasm of skin: Secondary | ICD-10-CM

## 2020-11-23 DIAGNOSIS — L821 Other seborrheic keratosis: Secondary | ICD-10-CM

## 2020-11-23 DIAGNOSIS — Z808 Family history of malignant neoplasm of other organs or systems: Secondary | ICD-10-CM

## 2020-11-23 DIAGNOSIS — D1800 Hemangioma unspecified site: Secondary | ICD-10-CM | POA: Diagnosis not present

## 2020-11-23 DIAGNOSIS — L814 Other melanin hyperpigmentation: Secondary | ICD-10-CM

## 2020-11-23 DIAGNOSIS — D229 Melanocytic nevi, unspecified: Secondary | ICD-10-CM | POA: Diagnosis not present

## 2020-11-23 NOTE — Progress Notes (Signed)
   New Patient Visit  Subjective  [name] is a 40 y.o. female who presents for the following: New Patient (Initial Visit) (Patient reports a family history of skin cancer in her father. Patient reports a history of some places removed. States all benign. She saw an Probation officer last name Jimmye Norman. Patient reports she sometimes feels spots at left lower leg and right lower leg, right chest, and has white spots at arms. ).  Patient here for full body skin exam and skin cancer screening.   The following portions of the chart were reviewed this encounter and updated as appropriate:   Tobacco  Allergies  Meds  Problems  Med Hx  Surg Hx  Fam Hx      Review of Systems:  No other skin or systemic complaints except as noted in HPI or Assessment and Plan.  Objective  Well appearing patient in no apparent distress; mood and affect are within normal limits.  A full examination was performed including scalp, head, eyes, ears, nose, lips, neck, chest, axillae, abdomen, back, buttocks, bilateral upper extremities, bilateral lower extremities, hands, feet, fingers, toes, fingernails, and toenails. All findings within normal limits unless otherwise noted below.    Assessment & Plan  Lentigines - Scattered tan macules - Due to sun exposure - Benign-appearing, observe - Recommend daily broad spectrum sunscreen SPF 30+ to sun-exposed areas, reapply every 2 hours as needed. - Call for any changes  Seborrheic Keratoses - Stuck-on, waxy, tan-brown papules and/or plaques  - Benign-appearing - Discussed benign etiology and prognosis. - Observe - Call for any changes  Melanocytic Nevi - Tan-brown and/or pink-flesh-colored symmetric macules and papules and at bottom right foot 0.3 cm thin medium brown papule  - Benign appearing on exam today - Observation - Call clinic for new or changing moles - Recommend daily use of broad spectrum spf 30+ sunscreen to sun-exposed areas.    Hemangiomas - Red papules - Discussed benign nature - Observe - Call for any changes  Dermatofibroma - Firm pink/brown papulenodule with dimple sign at left lower leg x 2, right lower leg at right chest  - Benign appearing - Call for any changes  Actinic Damage - Chronic condition, secondary to cumulative UV/sun exposure - diffuse scaly erythematous macules with underlying dyspigmentation - Recommend daily broad spectrum sunscreen SPF 30+ to sun-exposed areas, reapply every 2 hours as needed.  - Staying in the shade or wearing long sleeves, sun glasses (UVA+UVB protection) and wide brim hats (4-inch brim around the entire circumference of the hat) are also recommended for sun protection.  - Call for new or changing lesions.  Skin cancer screening performed today.  Return for 1-2 year tbse .  I, Ruthell Rummage, CMA, am acting as scribe for Forest Gleason, MD.  Documentation: I have reviewed the above documentation for accuracy and completeness, and I agree with the above.  Forest Gleason, MD

## 2020-11-23 NOTE — Patient Instructions (Signed)
Recommend taking Heliocare sun protection supplement daily in sunny weather for additional sun protection. For maximum protection on the sunniest days, you can take up to 2 capsules of regular Heliocare OR take 1 capsule of Heliocare Ultra. For prolonged exposure (such as a full day in the sun), you can repeat your dose of the supplement 4 hours after your first dose. Heliocare can be purchased at [facility] or at [email or web address].     Melanoma ABCDEs  Melanoma is the most dangerous type of skin cancer, and is the leading cause of death from skin disease.  You are more likely to develop melanoma if you: Have light-colored skin, light-colored eyes, or red or blond hair Spend a lot of time in the sun Tan regularly, either outdoors or in a tanning bed Have had blistering sunburns, especially during childhood Have a close family member who has had a melanoma Have atypical moles or large birthmarks  Early detection of melanoma is key since treatment is typically straightforward and cure rates are extremely high if we catch it early.   The first sign of melanoma is often a change in a mole or a new dark spot.  The ABCDE system is a way of remembering the signs of melanoma.  A for asymmetry:  The two halves do not match. B for border:  The edges of the growth are irregular. C for color:  A mixture of colors are present instead of an even brown color. D for diameter:  Melanomas are usually (but not always) greater than 56mm - the size of a pencil eraser. E for evolution:  The spot keeps changing in size, shape, and color.  Please check your skin once per month between visits. You can use a small mirror in front and a large mirror behind you to keep an eye on the back side or your body.   If you see any new or changing lesions before your next follow-up, please call to schedule a visit.  Please continue daily skin protection including broad spectrum sunscreen SPF 30+ to sun-exposed areas,  reapplying every 2 hours as needed when you're outdoors.   Staying in the shade or wearing long sleeves, sun glasses (UVA+UVB protection) and wide brim hats (4-inch brim around the entire circumference of the hat) are also recommended for sun protection.    If you have any questions or concerns for your doctor, please call our main line at [phone] and press option 4 to reach your doctor's medical assistant. If no one answers, please leave a voicemail as directed and we will return your call as soon as possible. Messages left after 4 pm will be answered the following business day.   You may also send [place] a message via [place]. We typically respond to MyChart messages within 1-2 business days.  For prescription refills, please ask your pharmacy to contact our office. Our fax number is [phone].  If you have an urgent issue when the clinic is closed that cannot wait until the next business day, you can page your doctor at the number below.    Please note that while we do our best to be available for urgent issues outside of office hours, we are not available 24/7.   If you have an urgent issue and are unable to reach [place], you may choose to seek medical care at your doctor's office, retail clinic, urgent care center, or emergency room.  If you have a medical emergency, please immediately call 911 or go  to the emergency department.  Pager Numbers  - Dr. Nehemiah Massed: [phone]  - Dr. Laurence Ferrari: [phone]  - Dr. Nicole Kindred: [phone]  In the event of inclement weather, please call our main line at [phone] for an update on the status of any delays or closures.  Dermatology Medication Tips: Please keep the boxes that topical medications come in in order to help keep track of the instructions about where and how to use these. Pharmacies typically print the medication instructions only on the boxes and not directly on the medication tubes.   If your medication is too expensive, please  contact our office at [phone] option 4 or send [place] a message through [place].   We are unable to tell what your co-pay for medications will be in advance as this is different depending on your insurance coverage. However, we may be able to find a substitute medication at lower cost or fill out paperwork to get insurance to cover a needed medication.   If a prior authorization is required to get your medication covered by your insurance company, please allow [place] 1-2 business days to complete this process.  Drug prices often vary depending on where the prescription is filled and some pharmacies may offer cheaper prices.  The website www.goodrx.com contains coupons for medications through different pharmacies. The prices here do not account for what the cost may be with help from insurance (it may be cheaper with your insurance), but the website can give you the price if you did not use any insurance.  - You can print the associated coupon and take it with your prescription to the pharmacy.  - You may also stop by our office during regular business hours and pick up a GoodRx coupon card.  - If you need your prescription sent electronically to a different pharmacy, notify our office through [facility] or by phone at [phone] option 4.

## 2020-11-29 ENCOUNTER — Encounter: Payer: Self-pay | Admitting: Dermatology

## 2021-05-26 IMAGING — MR MR LUMBAR SPINE W/O CM
5 series · 31 of 48 positions shown · non-contrast
Comparison: 08/31/2019 lumbar spine radiographs.

CLINICAL DATA: Back injury, pain, numbness and tingling.

EXAM:
MRI LUMBAR SPINE WITHOUT CONTRAST
TECHNIQUE: Multiplanar, multisequence MR imaging of the lumbar spine was
performed. No intravenous contrast was administered.

[Series 5: T2 · sagittal · 4.0mm · 0.81mm/px · 6 of 17 slices shown (1 of 2)]
[im 1/17]
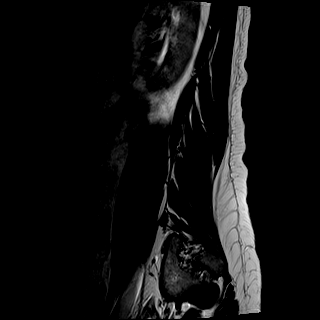
[im 4/17]
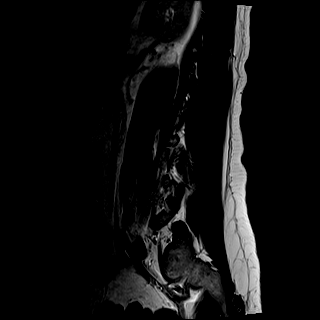
[im 7/17]
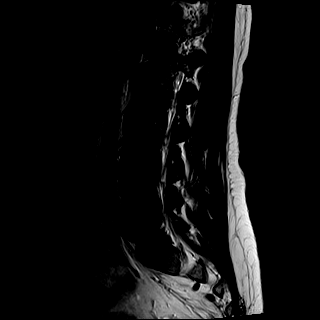
[im 10/17]
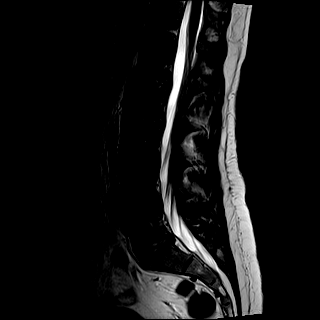
[im 13/17]
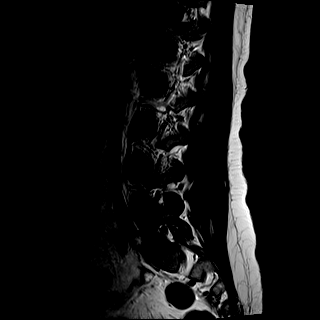
[im 17/17]
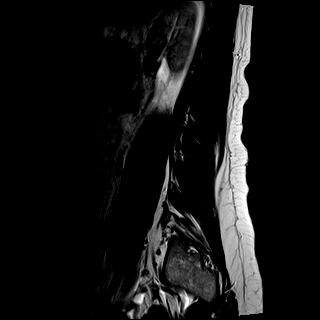

[Series 6: T1 · sagittal · 4.0mm · 0.81mm/px · 7 of 17 slices shown (1 of 2)]
[im 1/17]
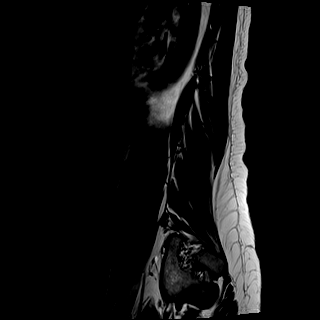
[im 3/17]
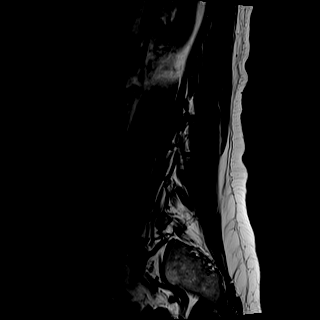
[im 6/17]
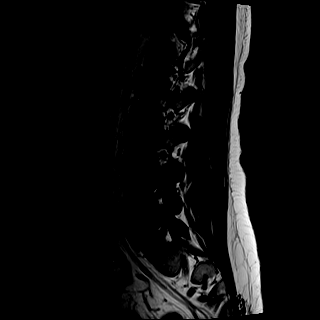
[im 9/17]
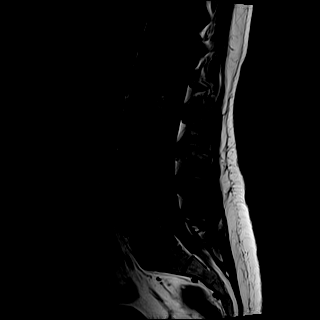
[im 11/17]
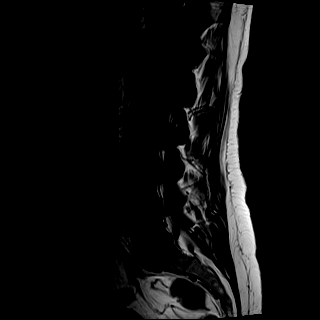
[im 14/17]
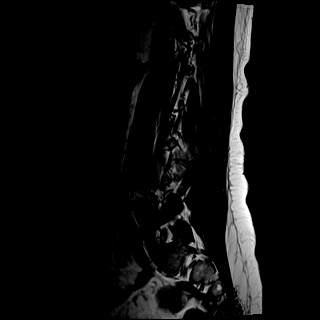
[im 17/17]
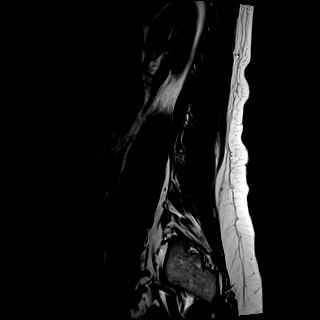

[Series 7: STIR · sagittal · 4.0mm · 0.41mm/px · 2 of 17 slices shown]
[im 1/17]
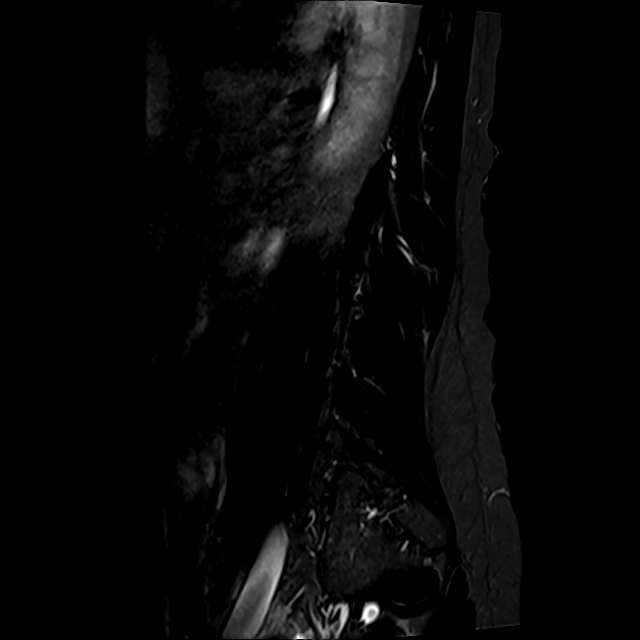
[im 3/17]
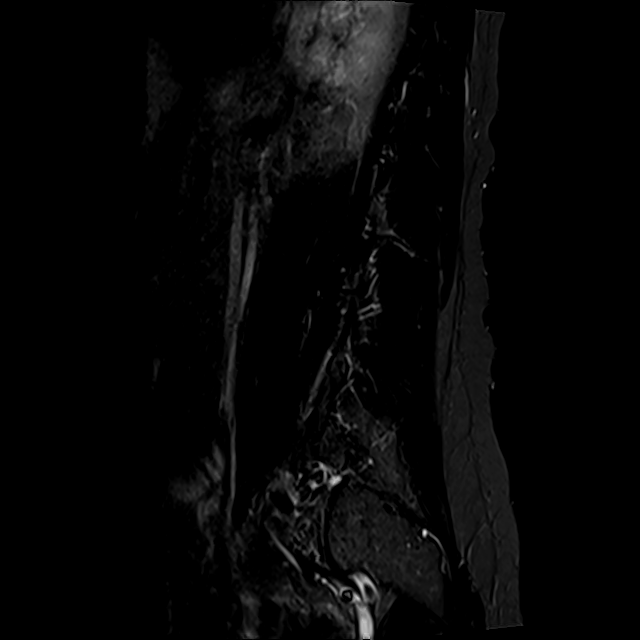

[Series 8: T2 · axial · 4.0mm · 0.78mm/px · z∈[-137,+65]mm · 8 of 36 slices shown (2 of 2)]
[im 1/36]
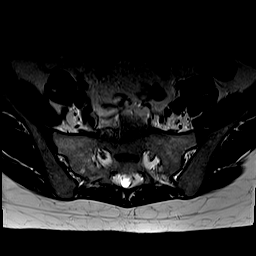
[im 6/36]
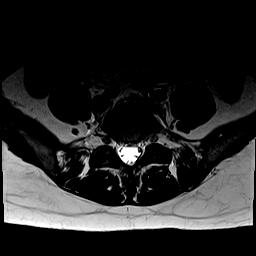
[im 11/36]
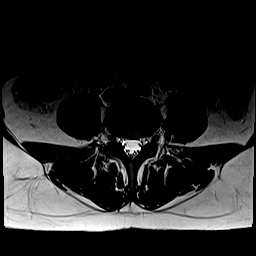
[im 17/36]
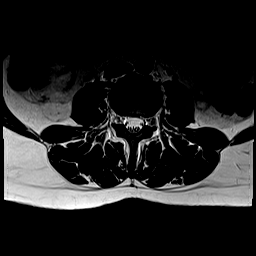
[im 19/36]
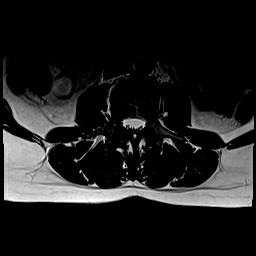
[im 25/36]
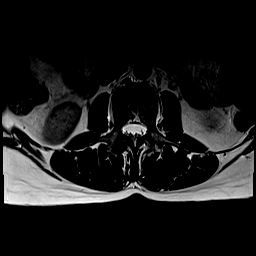
[im 30/36]
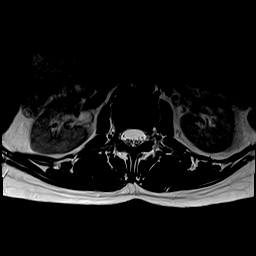
[im 36/36]
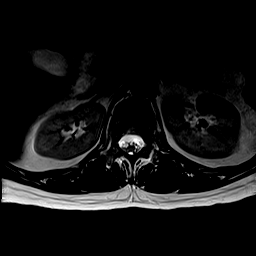

[Series 9: T1 · axial · 4.0mm · 0.39mm/px · z∈[-137,+65]mm · 8 of 36 slices shown (2 of 2)]
[im 1/36]
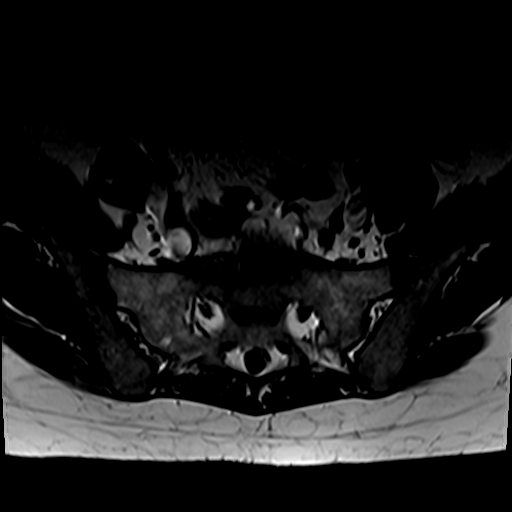
[im 6/36]
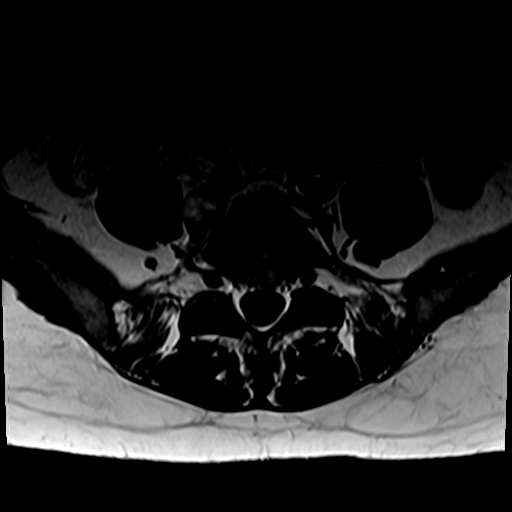
[im 11/36]
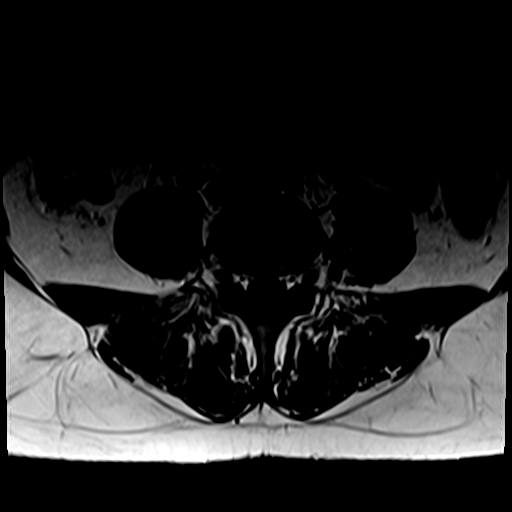
[im 17/36]
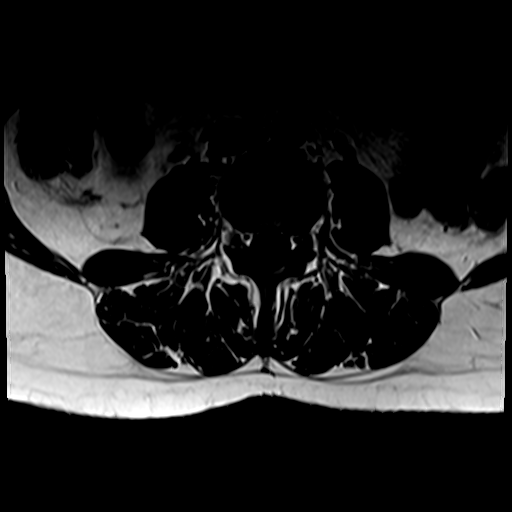
[im 19/36]
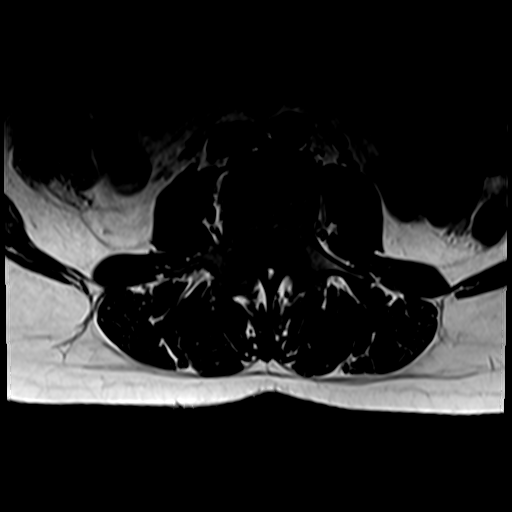
[im 25/36]
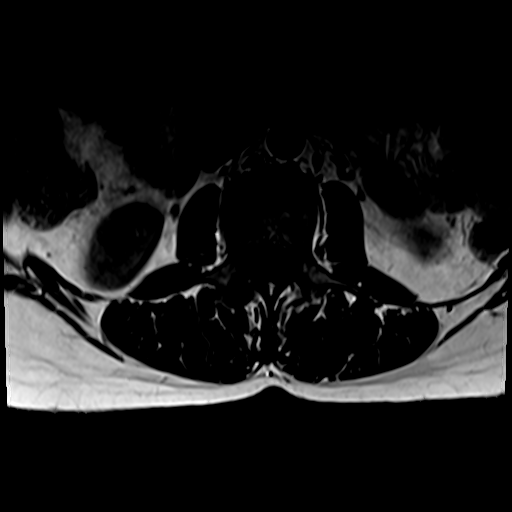
[im 30/36]
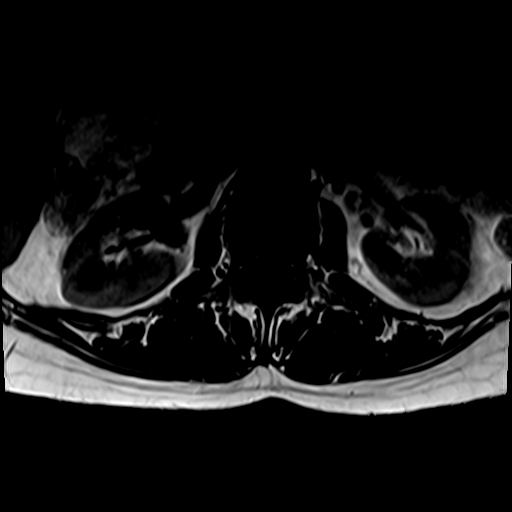
[im 36/36]
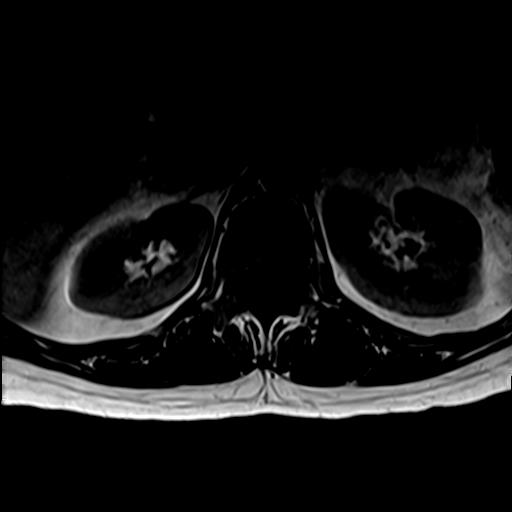

[31 of 48 positions shown; findings below may reference images not displayed]

FINDINGS: Segmentation:  Standard.

Alignment:  Normal.

Vertebrae: Normal bone marrow signal intensity. No focal osseous
lesions.

Conus medullaris and cauda equina: Conus extends to the L1 level.
Conus and cauda equina appear normal.

Disc levels: Minimal to mild L5-S1 disc space loss. Remaining disc
spaces are preserved.

L1-2: No significant disc bulge, spinal canal or neural foraminal
narrowing.

L2-3: No significant disc bulge, spinal canal or neural foraminal
narrowing.

L3-4: Mild disc bulge. No significant spinal canal or neural
foraminal narrowing.

L4-5: Disc bulge with small superimposed right extraforaminal
protrusion/annular fissuring grazing the exiting right L4 nerve
root. There is also a small superimposed left foraminal protrusion
abutting the exiting L4 nerve root. Bilateral facet degenerative
spurring. Mild bilateral neural foraminal narrowing. Patent spinal
canal.

L5-S1: Disc bulge with superimposed inferiorly oriented central
protrusion. There is annular fissuring along the under surface of
the protrusion. Bilateral facet hypertrophy. No significant spinal
canal or neural foraminal narrowing.

Paraspinal and other soft tissues: Paraspinal soft tissues within
normal limits. Left renal cysts.
IMPRESSION: No acute osseous abnormality.

Small right extraforaminal and left foraminal protrusions with
annular fissuring at the L4-5 level abutting the exiting bilateral
L4 nerve roots.

Mild bilateral L4-5 neural foraminal narrowing.

Inferiorly oriented small L5-S1 central protrusion/annular fissuring
without significant spinal canal or neural foraminal narrowing.

## 2021-12-13 ENCOUNTER — Ambulatory Visit: Payer: BC Managed Care – PPO | Admitting: Dermatology

## 2021-12-13 ENCOUNTER — Encounter: Payer: Self-pay | Admitting: Dermatology

## 2021-12-13 VITALS — BP 138/89 | HR 80

## 2021-12-13 DIAGNOSIS — D229 Melanocytic nevi, unspecified: Secondary | ICD-10-CM

## 2021-12-13 DIAGNOSIS — L739 Follicular disorder, unspecified: Secondary | ICD-10-CM

## 2021-12-13 DIAGNOSIS — L821 Other seborrheic keratosis: Secondary | ICD-10-CM | POA: Diagnosis not present

## 2021-12-13 DIAGNOSIS — Z1283 Encounter for screening for malignant neoplasm of skin: Secondary | ICD-10-CM

## 2021-12-13 DIAGNOSIS — D2372 Other benign neoplasm of skin of left lower limb, including hip: Secondary | ICD-10-CM

## 2021-12-13 DIAGNOSIS — L814 Other melanin hyperpigmentation: Secondary | ICD-10-CM | POA: Diagnosis not present

## 2021-12-13 DIAGNOSIS — D235 Other benign neoplasm of skin of trunk: Secondary | ICD-10-CM

## 2021-12-13 DIAGNOSIS — L578 Other skin changes due to chronic exposure to nonionizing radiation: Secondary | ICD-10-CM

## 2021-12-13 MED ORDER — CLINDAMYCIN PHOSPHATE 1 % EX GEL: Freq: Two times a day (BID) | CUTANEOUS | 5 refills | Status: AC

## 2021-12-13 NOTE — Progress Notes (Signed)
Follow-Up Visit   Subjective  [name] is a 41 y.o. female who presents for the following: Annual Exam ([facility] [place] in patient's father).  The patient presents for Total-Body Skin Exam (TBSE) for skin cancer screening and mole check.  The patient has spots, moles and lesions to be evaluated, some may be new or changing and the patient has concerns that these could be cancer.  Family history of skin cancer - what type(s): BCC - who affected: patient's father   The following portions of the chart were reviewed this encounter and updated as appropriate:   Tobacco  Allergies  Meds  Problems  Med Hx  Surg Hx  Fam Hx      Review of Systems:  No other skin or systemic complaints except as noted in HPI or Assessment and Plan.  Objective  Well appearing patient in no apparent distress; mood and affect are within normal limits.  A full examination was performed including scalp, head, eyes, ears, nose, lips, neck, chest, axillae, abdomen, back, buttocks, bilateral upper extremities, bilateral lower extremities, hands, feet, fingers, toes, fingernails, and toenails. All findings within normal limits unless otherwise noted below.  Scalp Follicular-based erythematous papules and pustules.     Assessment & Plan  Folliculitis Scalp  Start clindamycin gel twice daily to affected areas.  If not improving patient will call and we can send in [facility] 0.1% lotion to use twice daily as needed. Avoid applying to face, groin, and axilla. Use as directed. Long-term use can cause thinning of the skin.  Topical steroids (such as triamcinolone, fluocinolone, fluocinonide, mometasone, clobetasol, halobetasol, betamethasone, hydrocortisone) can cause thinning and lightening of the skin if they are used for too long in the same area. Your physician has selected the right strength medicine for your problem and area affected on the body. Please use your medication only as directed by your physician to  prevent side effects.    clindamycin (CLINDAGEL) 1 % gel - Scalp Apply topically 2 (two) times daily.   Lentigines - Scattered tan macules - Due to sun exposure - Benign-appearing, observe - Recommend daily broad spectrum sunscreen SPF 30+ to sun-exposed areas, reapply every 2 hours as needed. - Call for any changes  Seborrheic Keratoses - Stuck-on, waxy, tan-brown papules and/or plaques  - Benign-appearing - Discussed benign etiology and prognosis. - Observe - Call for any changes  Melanocytic Nevi - Tan-brown and/or pink-flesh-colored symmetric macules and papules - Benign appearing on exam today - Observation - Call clinic for new or changing moles - Recommend daily use of broad spectrum spf 30+ sunscreen to sun-exposed areas.   Hemangiomas - Red papules - Discussed benign nature - Observe - Call for any changes  Actinic Damage - Chronic condition, secondary to cumulative UV/sun exposure - diffuse scaly erythematous macules with underlying dyspigmentation - Recommend daily broad spectrum sunscreen SPF 30+ to sun-exposed areas, reapply every 2 hours as needed.  - Staying in the shade or wearing long sleeves, sun glasses (UVA+UVB protection) and wide brim hats (4-inch brim around the entire circumference of the hat) are also recommended for sun protection.  - Call for new or changing lesions.  Skin cancer screening performed today.  Dermatofibroma - Firm pink/brown papulenodule with dimple sign at left lateral calf, right superior breast - Benign appearing - Call for any changes  Return for TBSE 1-2 years.  Graciella Belton, RMA, am acting as scribe for Forest Gleason, MD .  Documentation: I have reviewed the above documentation  for accuracy and completeness, and I agree with the above.  Forest Gleason, MD

## 2021-12-13 NOTE — Patient Instructions (Addendum)
Start clindamycin gel twice daily to affected areas.  If not improving patient will call and we can send in [facility] 0.1% lotion to use twice daily as needed. Avoid applying to face, groin, and axilla. Use as directed. Long-term use can cause thinning of the skin.  Topical steroids (such as triamcinolone, fluocinolone, fluocinonide, mometasone, clobetasol, halobetasol, betamethasone, hydrocortisone) can cause thinning and lightening of the skin if they are used for too long in the same area. Your physician has selected the right strength medicine for your problem and area affected on the body. Please use your medication only as directed by your physician to prevent side effects.    Recommend taking Heliocare sun protection supplement daily in sunny weather for additional sun protection. For maximum protection on the sunniest days, you can take up to 2 capsules of regular Heliocare OR take 1 capsule of Heliocare Ultra. For prolonged exposure (such as a full day in the sun), you can repeat your dose of the supplement 4 hours after your first dose. Heliocare can be purchased at [employer], at some [facility] or at [email or web address].    Recommend Vitamin D 600iu daily.   Melanoma ABCDEs  Melanoma is the most dangerous type of skin cancer, and is the leading cause of death from skin disease.  You are more likely to develop melanoma if you: Have light-colored skin, light-colored eyes, or red or blond hair Spend a lot of time in the sun Tan regularly, either outdoors or in a tanning bed Have had blistering sunburns, especially during childhood Have a close family member who has had a melanoma Have atypical moles or large birthmarks  Early detection of melanoma is key since treatment is typically straightforward and cure rates are extremely high if we catch it early.   The first sign of melanoma is often a change in a mole or a new dark spot.  The ABCDE system is a way of remembering the signs of  melanoma.  A for asymmetry:  The two halves do not match. B for border:  The edges of the growth are irregular. C for color:  A mixture of colors are present instead of an even brown color. D for diameter:  Melanomas are usually (but not always) greater than 29m - the size of a pencil eraser. E for evolution:  The spot keeps changing in size, shape, and color.  Please check your skin once per month between visits. You can use a small mirror in front and a large mirror behind you to keep an eye on the back side or your body.   If you see any new or changing lesions before your next follow-up, please call to schedule a visit.  Please continue daily skin protection including broad spectrum sunscreen SPF 30+ to sun-exposed areas, reapplying every 2 hours as needed when you're outdoors.    Due to recent changes in healthcare laws, you may see results of your pathology and/or laboratory studies on MyChart before the doctors have had a chance to review them. We understand that in some cases there may be results that are confusing or concerning to you. Please understand that not all results are received at the same time and often the doctors may need to interpret multiple results in order to provide you with the best plan of care or course of treatment. Therefore, we ask that you please give u[place]2 business days to thoroughly review all your results before contacting the office for clarification. Should we see  a critical lab result, you will be contacted sooner.   If You Need Anything After Your Visit  If you have any questions or concerns for your doctor, please call our main line at [phone] and press option 4 to reach your doctor's medical assistant. If no one answers, please leave a voicemail as directed and we will return your call as soon as possible. Messages left after 4 pm will be answered the following business day.   You may also send [place] a message via [place]. We typically respond to  MyChart messages within 1-2 business days.  For prescription refills, please ask your pharmacy to contact our office. Our fax number is [phone].  If you have an urgent issue when the clinic is closed that cannot wait until the next business day, you can page your doctor at the number below.    Please note that while we do our best to be available for urgent issues outside of office hours, we are not available 24/7.   If you have an urgent issue and are unable to reach [place], you may choose to seek medical care at your doctor's office, retail clinic, urgent care center, or emergency room.  If you have a medical emergency, please immediately call 911 or go to the emergency department.  Pager Numbers  - Dr. Nehemiah Massed: [phone]  - Dr. Laurence Ferrari: [phone]  - Dr. Nicole Kindred: [phone]  In the event of inclement weather, please call our main line at [phone] for an update on the status of any delays or closures.  Dermatology Medication Tips: Please keep the boxes that topical medications come in in order to help keep track of the instructions about where and how to use these. Pharmacies typically print the medication instructions only on the boxes and not directly on the medication tubes.   If your medication is too expensive, please contact our office at [phone] option 4 or send [place] a message through [place].   We are unable to tell what your co-pay for medications will be in advance as this is different depending on your insurance coverage. However, we may be able to find a substitute medication at lower cost or fill out paperwork to get insurance to cover a needed medication.   If a prior authorization is required to get your medication covered by your insurance company, please allow [place] 1-2 business days to complete this process.  Drug prices often vary depending on where the prescription is filled and some pharmacies may offer cheaper prices.  The website www.goodrx.com  contains coupons for medications through different pharmacies. The prices here do not account for what the cost may be with help from insurance (it may be cheaper with your insurance), but the website can give you the price if you did not use any insurance.  - You can print the associated coupon and take it with your prescription to the pharmacy.  - You may also stop by our office during regular business hours and pick up a GoodRx coupon card.  - If you need your prescription sent electronically to a different pharmacy, notify our office through [facility] or by phone at [phone] option 4.     Si Usted Necesita Algo Despus de Su Visita  Tambin puede enviarnos un mensaje a travs de Pharmacist, community. Por lo general respondemos a los mensajes de MyChart en el transcurso de 1 a 2 das hbiles.  Para renovar recetas, por favor pida a su farmacia que se ponga en contacto con  nuestra oficina. Harland Dingwall de fax es [place] [phone].  Si tiene un asunto urgente cuando la clnica est cerrada y que no puede esperar hasta el siguiente da hbil, puede llamar/localizar a su doctor(a) al nmero que aparece a continuacin.   Por favor, tenga en cuenta que aunque hacemos todo lo posible para estar disponibles para asuntos urgentes fuera del horario de [place], no estamos disponibles las 24 horas del da, los 7 das de la [place].   Si tiene un problema urgente y no puede comunicarse con nosotros, puede optar por buscar atencin mdica  en el consultorio de su doctor(a), en una clnica privada, en un centro de atencin urgente o en una sala de emergencias.  Si tiene Engineering geologist, por favor llame inmediatamente al 911 o vaya a la sala de emergencias.  Nmeros de bper  - Dr. Nehemiah Massed: [phone]  - Dra. Moye: [phone]  - Dra. Nicole Kindred: [phone]  En caso de inclemencias del [place], por favor llame a Johnsie Kindred principal al [phone] para una actualizacin sobre el [place]  de cualquier retraso o cierre.  Consejos para la medicacin en dermatologa: Por favor, guarde las cajas en las que vienen los medicamentos de uso tpico para ayudarle a seguir las instrucciones sobre dnde y cmo usarlos. Las farmacias generalmente imprimen las instrucciones del medicamento slo en las cajas y no directamente en los tubos del [place] [employer].   Si su medicamento es muy caro, por favor, pngase en contacto con Zigmund Daniel llamando al [phone] y presione la opcin 4 o envenos un mensaje a travs de Pharmacist, community.   No podemos decirle cul ser su copago por los medicamentos por adelantado ya que esto es diferente dependiendo de la cobertura de su seguro. Sin embargo, es posible que podamos encontrar un medicamento sustituto a Electrical engineer un formulario para que el seguro cubra el medicamento que se considera necesario.   Si se requiere una autorizacin previa para que su compaa de seguros [place] su medicamento, por favor permtanos de 1 a 2 das hbiles para completar este proceso.  Los precios de los medicamentos varan con frecuencia dependiendo del Environmental consultant de dnde se surte la receta y alguna farmacias pueden ofrecer precios ms baratos.  El sitio web www.goodrx.com tiene cupones para medicamentos de Airline pilot. Los precios aqu no tienen en cuenta lo que podra costar con la ayuda del seguro (puede ser ms barato con su seguro), pero el sitio web puede darle el precio si no utiliz Research scientist (physical sciences).  - Puede imprimir el cupn correspondiente y llevarlo con su receta a la farmacia.  - Tambin puede pasar por nuestra oficina durante el horario de atencin regular y Charity fundraiser una tarjeta de cupones de GoodRx.  - Si necesita que su receta se enve electrnicamente a una farmacia diferente, informe a nuestra oficina a travs de MyChart de  o por telfono llamando al [phone] y presione la opcin 4.

## 2022-12-18 ENCOUNTER — Encounter: Payer: BC Managed Care – PPO | Admitting: Dermatology

## 2022-12-19 ENCOUNTER — Encounter: Payer: BC Managed Care – PPO | Admitting: Dermatology

## 2023-01-24 ENCOUNTER — Encounter: Payer: Self-pay | Admitting: Dermatology

## 2023-01-24 ENCOUNTER — Ambulatory Visit: Payer: BC Managed Care – PPO | Admitting: Dermatology

## 2023-01-24 DIAGNOSIS — D2271 Melanocytic nevi of right lower limb, including hip: Secondary | ICD-10-CM

## 2023-01-24 DIAGNOSIS — W908XXA Exposure to other nonionizing radiation, initial encounter: Secondary | ICD-10-CM

## 2023-01-24 DIAGNOSIS — Z1283 Encounter for screening for malignant neoplasm of skin: Secondary | ICD-10-CM

## 2023-01-24 DIAGNOSIS — L814 Other melanin hyperpigmentation: Secondary | ICD-10-CM

## 2023-01-24 DIAGNOSIS — L821 Other seborrheic keratosis: Secondary | ICD-10-CM

## 2023-01-24 DIAGNOSIS — L578 Other skin changes due to chronic exposure to nonionizing radiation: Secondary | ICD-10-CM | POA: Diagnosis not present

## 2023-01-24 DIAGNOSIS — D1801 Hemangioma of skin and subcutaneous tissue: Secondary | ICD-10-CM

## 2023-01-24 DIAGNOSIS — D2372 Other benign neoplasm of skin of left lower limb, including hip: Secondary | ICD-10-CM

## 2023-01-24 DIAGNOSIS — D239 Other benign neoplasm of skin, unspecified: Secondary | ICD-10-CM

## 2023-01-24 DIAGNOSIS — D492 Neoplasm of unspecified behavior of bone, soft tissue, and skin: Secondary | ICD-10-CM

## 2023-01-24 DIAGNOSIS — D229 Melanocytic nevi, unspecified: Secondary | ICD-10-CM

## 2023-01-24 DIAGNOSIS — D235 Other benign neoplasm of skin of trunk: Secondary | ICD-10-CM

## 2023-01-24 DIAGNOSIS — Z808 Family history of malignant neoplasm of other organs or systems: Secondary | ICD-10-CM

## 2023-01-24 NOTE — Progress Notes (Signed)
   Follow-Up Visit   Subjective  [name] is a 43 y.o. female who presents for the following: Skin Cancer Screening and Full Body Skin Exam. No personal hx of skin cancer or dysplastic nevi. Father had H/O BCCs on face.   The patient presents for Total-Body Skin Exam (TBSE) for skin cancer screening and mole check. The patient has spots, moles and lesions to be evaluated, some may be new or changing and the patient may have concern these could be cancer.    The following portions of the chart were reviewed this encounter and updated as appropriate: medications, allergies, medical history  Review of Systems:  No other skin or systemic complaints except as noted in HPI or Assessment and Plan.  Objective  Well appearing patient in no apparent distress; mood and affect are within normal limits.  A full examination was performed including scalp, head, eyes, ears, nose, lips, neck, chest, axillae, abdomen, back, buttocks, bilateral upper extremities, bilateral lower extremities, hands, feet, fingers, toes, fingernails, and toenails. All findings within normal limits unless otherwise noted below.   Relevant physical exam findings are noted in the Assessment and Plan.  Left upper chest 9 mm light to medium brown irregular macule   Assessment & Plan   FAMILY HISTORY OF SKIN CANCER What type(s): BCC Who affected: Father   SKIN CANCER SCREENING PERFORMED TODAY.  ACTINIC DAMAGE - Chronic condition, secondary to cumulative UV/sun exposure - diffuse scaly erythematous macules with underlying dyspigmentation - Recommend daily broad spectrum sunscreen SPF 30+ to sun-exposed areas, reapply every 2 hours as needed.  - Staying in the shade or wearing long sleeves, sun glasses (UVA+UVB protection) and wide brim hats (4-inch brim around the entire circumference of the hat) are also recommended for sun protection.  - Call for new or changing lesions.  LENTIGINES, SEBORRHEIC KERATOSES,  HEMANGIOMAS - Benign normal skin lesions - Benign-appearing - Call for any changes  MELANOCYTIC NEVI - Tan-brown and/or pink-flesh-colored symmetric macules and papules - Benign appearing on exam today - Observation - Call clinic for new or changing moles - Recommend daily use of broad spectrum spf 30+ sunscreen to sun-exposed areas.    DERMATOFIBROMA Exam: Firm pink/brown papulenodule with dimple sign at left lateral calf, right superior breast. Treatment Plan: A dermatofibroma is a benign growth possibly related to trauma, such as an insect bite, cut from shaving, or inflamed acne-type bump.  Treatment options to remove include shave or excision with resulting scar and risk of recurrence.  Since benign-appearing and not bothersome, will observe for now.    MELANOCYTIC NEVUS Exam: Brown macule at right plantar foot  Treatment Plan: Benign appearing on exam today. Recommend observation. Call clinic for new or changing moles. Recommend daily use of broad spectrum spf 30+ sunscreen to sun-exposed areas.    NEOPLASM OF SKIN Left upper chest Recommended biopsy. Patient prefers to return for biopsy at this time. Recommend biopsy within the next 3 months.  MULTIPLE BENIGN NEVI   LENTIGINES   ACTINIC ELASTOSIS   SEBORRHEIC KERATOSES   CHERRY ANGIOMA   Return in about 1 year (around 01/24/2024) for TBSE.  I, Kate Fought, CMA, am acting as scribe for Boneta Sharps, MD.   Documentation: I have reviewed the above documentation for accuracy and completeness, and I agree with the above.  Boneta Sharps, MD

## 2023-01-24 NOTE — Patient Instructions (Addendum)

## 2023-03-12 ENCOUNTER — Encounter: Payer: Self-pay | Admitting: Dermatology

## 2023-03-12 ENCOUNTER — Ambulatory Visit: Payer: BC Managed Care – PPO | Admitting: Dermatology

## 2023-03-12 DIAGNOSIS — D492 Neoplasm of unspecified behavior of bone, soft tissue, and skin: Secondary | ICD-10-CM

## 2023-03-12 DIAGNOSIS — L821 Other seborrheic keratosis: Secondary | ICD-10-CM

## 2023-03-12 NOTE — Patient Instructions (Addendum)

## 2023-03-12 NOTE — Progress Notes (Signed)
   Follow-Up Visit   Subjective  [name] is a 43 y.o. female who presents for the following: spot at L upper chest  The patient has spots, moles and lesions to be evaluated, some may be new or changing and the patient may have concern these could be cancer.   The following portions of the chart were reviewed this encounter and updated as appropriate: medications, allergies, medical history  Review of Systems:  No other skin or systemic complaints except as noted in HPI or Assessment and Plan.  Objective  Well appearing patient in no apparent distress; mood and affect are within normal limits.   A focused examination was performed of the following areas: Chest, back, abdomen  Relevant exam findings are noted in the Assessment and Plan.  L upper chest 9 mm light to medium brown irregular macule   Assessment & Plan     NEOPLASM OF SKIN L upper chest Skin / nail biopsy Type of biopsy: tangential   Informed consent: discussed and consent obtained   Timeout: patient name, date of birth, surgical site, and procedure verified   Procedure prep:  Patient was prepped and draped in usual sterile fashion Prep type:  Isopropyl alcohol Anesthesia: the lesion was anesthetized in a standard fashion   Anesthetic:  1% lidocaine w/ epinephrine 1-100,000 buffered w/ 8.4% NaHCO3 Instrument used: DermaBlade   Hemostasis achieved with: pressure and aluminum chloride   Outcome: patient tolerated procedure well   Post-procedure details: sterile dressing applied and wound care instructions given   Dressing type: bandage and petrolatum    Return for As scheduled, w/ Dr. Katrinka Blazing.  Wynonia Lawman, CMA, am acting as scribe for Elie Goody, MD .   Documentation: I have reviewed the above documentation for accuracy and completeness, and I agree with the above.  Elie Goody, MD

## 2023-03-13 ENCOUNTER — Encounter: Payer: Self-pay | Admitting: Dermatology

## 2023-03-13 LAB — SURGICAL PATHOLOGY

## 2023-03-14 ENCOUNTER — Ambulatory Visit: Payer: BC Managed Care – PPO | Admitting: Dermatology

## 2024-01-30 ENCOUNTER — Ambulatory Visit: Payer: BC Managed Care – PPO | Admitting: Dermatology

## 2025-01-25 ENCOUNTER — Ambulatory Visit: Admitting: Dermatology
